# Patient Record
Sex: Female | Born: 1965
Health system: Southern US, Community
[De-identification: ages and names within clinical notes are randomized; demographics above are authoritative.]

## PROBLEM LIST (undated history)

## (undated) DIAGNOSIS — F32A Depression, unspecified: Secondary | ICD-10-CM

## (undated) DIAGNOSIS — Z780 Asymptomatic menopausal state: Secondary | ICD-10-CM

## (undated) DIAGNOSIS — F419 Anxiety disorder, unspecified: Secondary | ICD-10-CM

## (undated) DIAGNOSIS — F329 Major depressive disorder, single episode, unspecified: Secondary | ICD-10-CM

## (undated) HISTORY — PX: TUBAL LIGATION: SHX77

---

## 2012-11-19 ENCOUNTER — Other Ambulatory Visit (HOSPITAL_COMMUNITY): Payer: Self-pay | Admitting: Specialist

## 2012-11-19 DIAGNOSIS — Z1231 Encounter for screening mammogram for malignant neoplasm of breast: Secondary | ICD-10-CM

## 2012-12-03 ENCOUNTER — Ambulatory Visit (HOSPITAL_COMMUNITY): Payer: Self-pay | Attending: Specialist

## 2012-12-27 ENCOUNTER — Ambulatory Visit (HOSPITAL_COMMUNITY)
Admission: RE | Admit: 2012-12-27 | Discharge: 2012-12-27 | Disposition: A | Discharge status: Home / Self Care | Payer: Self-pay | Source: Ambulatory Visit | Attending: Specialist | Admitting: Specialist

## 2012-12-27 DIAGNOSIS — Z1231 Encounter for screening mammogram for malignant neoplasm of breast: Secondary | ICD-10-CM

## 2012-12-30 ENCOUNTER — Other Ambulatory Visit: Payer: Self-pay | Admitting: Specialist

## 2012-12-30 DIAGNOSIS — R928 Other abnormal and inconclusive findings on diagnostic imaging of breast: Secondary | ICD-10-CM

## 2013-01-14 ENCOUNTER — Encounter (HOSPITAL_COMMUNITY): Payer: Self-pay

## 2013-01-14 ENCOUNTER — Ambulatory Visit
Admission: RE | Admit: 2013-01-14 | Discharge: 2013-01-14 | Disposition: A | Discharge status: Home / Self Care | Payer: No Typology Code available for payment source | Source: Ambulatory Visit | Attending: Specialist | Admitting: Specialist

## 2013-01-14 ENCOUNTER — Other Ambulatory Visit: Payer: Self-pay | Admitting: Specialist

## 2013-01-14 ENCOUNTER — Ambulatory Visit (HOSPITAL_COMMUNITY)
Admission: RE | Admit: 2013-01-14 | Discharge: 2013-01-14 | Disposition: A | Discharge status: Home / Self Care | Payer: Self-pay | Source: Ambulatory Visit | Attending: Obstetrics and Gynecology | Admitting: Obstetrics and Gynecology

## 2013-01-14 VITALS — BP 118/72 | Temp 98.6°F | Ht 61.0 in | Wt 198.0 lb

## 2013-01-14 DIAGNOSIS — Z1239 Encounter for other screening for malignant neoplasm of breast: Secondary | ICD-10-CM

## 2013-01-14 DIAGNOSIS — R928 Other abnormal and inconclusive findings on diagnostic imaging of breast: Secondary | ICD-10-CM

## 2013-01-14 HISTORY — DX: Anxiety disorder, unspecified: F41.9

## 2013-01-14 HISTORY — DX: Major depressive disorder, single episode, unspecified: F32.9

## 2013-01-14 HISTORY — DX: Depression, unspecified: F32.A

## 2013-01-14 NOTE — Patient Instructions (Signed)
Taught Lovely Kerins how to perform BSE and gave educational materials to take home. Patient did not need a Pap smear today due to last Pap smear was March 2014 per patient. Let her know [facility] will cover Pap smears every 3 years unless has a history of abnormal Pap smears. Referred patient to the [facility] for da left breast diagnostic mammogram per recommendation. Appointment scheduled for Tuesday, January 14, 2013 at 1515. Patient aware of appointment and will be there. Alicia Olson verbalized understanding.  Darcey Demma, Kathaleen Maser, RN 1:11 PM

## 2013-01-14 NOTE — Progress Notes (Signed)
Patient referred to [facility] by the [facility] due to recommending additional imaging of the left breast. Screening mammogram completed 12/27/2012 at [facility]. Patient complained of some sharpe pain from her left nipple that happens occasionally.  Pap Smear:    Pap smear not completed today. Last Pap smear was March 2014 and normal per patient. Per patient no history of an abnormal Pap smear. No Pap smear results in EPIC.  Physical exam: Breasts Breasts symmetrical. No skin abnormalities bilateral breasts. No nipple retraction bilateral breasts. No nipple discharge bilateral breasts. No lymphadenopathy. No lumps palpated bilateral breasts. No complaints of pain or tenderness on exam. Referred patient to the [facility] for da left breast diagnostic mammogram per recommendation. Appointment scheduled for Tuesday, January 14, 2013 at 1515.    Pelvic/Bimanual No Pap smear completed today since last Pap smear was March 2014. Pap smear not indicated per BCCCP guidelines.

## 2013-06-04 ENCOUNTER — Other Ambulatory Visit: Payer: Self-pay | Admitting: Obstetrics and Gynecology

## 2013-06-04 DIAGNOSIS — N6009 Solitary cyst of unspecified breast: Secondary | ICD-10-CM

## 2013-07-06 ENCOUNTER — Emergency Department (HOSPITAL_COMMUNITY)
Admission: EM | Admit: 2013-07-06 | Discharge: 2013-07-06 | Disposition: A | Discharge status: Home / Self Care | Payer: Worker's Compensation | Attending: Emergency Medicine | Admitting: Emergency Medicine

## 2013-07-06 ENCOUNTER — Encounter (HOSPITAL_COMMUNITY): Payer: Self-pay | Admitting: Emergency Medicine

## 2013-07-06 DIAGNOSIS — Y929 Unspecified place or not applicable: Secondary | ICD-10-CM | POA: Insufficient documentation

## 2013-07-06 DIAGNOSIS — Y9389 Activity, other specified: Secondary | ICD-10-CM | POA: Diagnosis not present

## 2013-07-06 DIAGNOSIS — T2660XA Corrosion of cornea and conjunctival sac, unspecified eye, initial encounter: Secondary | ICD-10-CM | POA: Insufficient documentation

## 2013-07-06 DIAGNOSIS — Z8659 Personal history of other mental and behavioral disorders: Secondary | ICD-10-CM | POA: Insufficient documentation

## 2013-07-06 DIAGNOSIS — T2610XA Burn of cornea and conjunctival sac, unspecified eye, initial encounter: Secondary | ICD-10-CM

## 2013-07-06 DIAGNOSIS — S058X9A Other injuries of unspecified eye and orbit, initial encounter: Secondary | ICD-10-CM | POA: Insufficient documentation

## 2013-07-06 DIAGNOSIS — IMO0002 Reserved for concepts with insufficient information to code with codable children: Secondary | ICD-10-CM | POA: Insufficient documentation

## 2013-07-06 DIAGNOSIS — S0510XA Contusion of eyeball and orbital tissues, unspecified eye, initial encounter: Secondary | ICD-10-CM | POA: Diagnosis present

## 2013-07-06 MED ORDER — TETRACAINE HCL 0.5 % OP SOLN: 2.0000 [drp] | Freq: Once | OPHTHALMIC | Status: DC

## 2013-07-06 MED ORDER — FLUORESCEIN SODIUM 1 MG OP STRP: 1.0000 | ORAL_STRIP | Freq: Once | OPHTHALMIC | Status: DC

## 2013-07-06 MED ORDER — POLYMYXIN B-TRIMETHOPRIM 10000-0.1 UNIT/ML-% OP SOLN
1.0000 [drp] | OPHTHALMIC | Status: DC
  Administered 2013-07-06: 1 [drp] via OPHTHALMIC
  Filled 2013-07-06: qty 10

## 2013-07-06 MED ORDER — POLYMYXIN B-TRIMETHOPRIM 10000-0.1 UNIT/ML-% OP SOLN: 1.0000 [drp] | Freq: Four times a day (QID) | OPHTHALMIC | Status: DC

## 2013-07-06 NOTE — Discharge Instructions (Signed)
Conjuntivitis por sustancias qumicas (Chemical Conjunctivitis) El profesional que lo asiste le ha diagnosticado que usted padece conjuntivitis producida por sustancias qumicas. Se trata de una irritacin de la zona interna del prpado y de la parte blanca del ojo. Las causas de la conjuntivitis pueden ser una infeccin, [place] o irritacin qumica. En su caso, el origen es una irritacin producida por un producto qumico. Casi siempre se asocia con lagrimeo, sensibilidad a la luz, sensacin (percepcin) de Geneticist, molecular ojo, hinchazn de los prpados, y con Glass blower/designer intenso. A pesar del dolor, se autolimita y podr mejorar en veinticuatro horas.  INSTRUCCIONES PARA EL CUIDADO DOMICILIARIO  Para aliviar la molestia, aplique una compresa limpia y fra en el ojo durante 10 a 20 minutos, 3 a 4 veces por da.  No se frote los ojos.  Limpie suavemente todas las secreciones del ojo con un tis hmedo.  Lvese las manos con frecuencia con jabn y use toallas de papel para secarse.  Puede utilizar anteojos para el sol si la [employer].  No utilice maquillaje.  No utilice lentes de contacto hasta que la irritacin haya desaparecido.  No opere maquinarias ni conduzca si su visin es borrosa.  Tome los [employer] como le indic el profesional que lo asiste. Las lgrimas artificiales pueden causar molestias.  Evite las sustancias qumicas o los ambientes que le causaron el problema. Use proteccin en los ojos siempre que sea necesario. SOLICITE ATENCIN MDICA SI:  Si los ojos an estn irritados (inflamados) luego de 3 das de haber comenzado el tratamiento.  El [employer] ojos [place].  Usted presenta una secrecin que proviene de un ojo.  Sus prpados estn pegados por la [employer]  Tiene una mayor sensibilidad a Naval architect.  La temperatura oral se eleva sin motivo por encima de 38,9 C (80 F) o segn le indique el profesional que lo asiste.  Comienza a Psychiatrist.  Tiene algn problema que pueda relacionarse con el medicamento que est tomando. SOLICITE ATENCIN MDICA DE INMEDIATO SI:  La visin es empeora.  Presenta dolor intenso en el ojo. EST SEGURO QUE:   Comprende las instrucciones para el alta mdica.  Controlar su enfermedad.  Solicitar atencin mdica de inmediato segn las indicaciones. Document Released: 02/15/2005 Document Revised: 07/31/2011 [facility] Patient Information 2014 [place], [place]. 1 drop to the left eye

## 2013-07-06 NOTE — ED Notes (Signed)
Pt arrived to the ED with a complaint of eye pain after she splashed a small about of bleach in her eye.  Pt used the eye wash to rinse her eye for about 10 minutes.  St this time she feels as if she has a grain of sand in her eye.

## 2013-07-06 NOTE — ED Provider Notes (Signed)
CSN: [id number]     Arrival date & time 07/06/13  2155 History  This chart was scribed for non-physician practitioner, Junius Creamer, [place] working with Arbie Cookey, MD by Frederich Balding, ED scribe. This patient was seen in room [place] and the patient's care was started at 10:33 PM.   Chief Complaint  Patient presents with  . Eye Pain   The history is provided by the patient. No language interpreter was used.   HPI Comments: Alicia Olson is a 48 y.o. female who presents to the Emergency Department complaining of sudden onset left eye pain that started 13 hours ago after getting bleach in it. Pt states she washed her eye in for about 10 minutes but states it still feels like she has a grain of sand in her eye. Denies other injury. Pt's last tetanus was 5-7 years ago.   Past Medical History  Diagnosis Date  . Anxiety   . Depression    Past Surgical History  Procedure Laterality Date  . Tubal ligation    . Cesarean section      3 previous sections   Family History  Problem Relation Age of Onset  . Diabetes Father   . Hypertension Father    History  Substance Use Topics  . Smoking status: Never Smoker   . Smokeless tobacco: Never Used  . Alcohol Use: No   OB History   Grav Para Term Preterm Abortions TAB SAB Ect Mult Living   5 3 3  2  2   3      Review of Systems  Eyes: Positive for pain. Negative for visual disturbance.  Gastrointestinal: Negative for nausea.  Neurological: Negative for dizziness.  All other systems reviewed and are negative.   Allergies  Review of patient's allergies indicates no known allergies.  Home Medications  No current outpatient prescriptions on file.  BP 126/69  Pulse 69  Temp(Src) 97.9 F (36.6 C) (Oral)  Resp 18  SpO2 97%  Physical Exam  Nursing note and vitals reviewed. Constitutional: She is oriented to person, place, and time. She appears well-developed and well-nourished. No distress.  HENT:  Head: Normocephalic  and atraumatic.  Eyes: EOM are normal. Pupils are equal, round, and reactive to light. Right eye exhibits no discharge. Left eye exhibits no discharge.  Slit lamp exam:      The left eye shows corneal abrasion.  pH in left eye is 7. Left eye with two corneal abrasions. Abrasion at 9 o'clock overlapping distal part of iris and an abrasion at 3 o'clock. 2 mm ovals not involving the pupil.   Neck: Normal range of motion. Neck supple.  Cardiovascular: Normal rate.   Pulmonary/Chest: Effort normal. No respiratory distress.  Musculoskeletal: Normal range of motion.  Neurological: She is alert and oriented to person, place, and time.  Skin: Skin is warm and dry. No erythema.  Psychiatric: She has a normal mood and affect. Her behavior is normal.    ED Course  Procedures (including critical care time)  DIAGNOSTIC STUDIES: Oxygen Saturation is 97% on RA, normal by my interpretation.    COORDINATION OF CARE: 10:34 PM-Discussed treatment plan which includes testing pH in eye and checking for corneal abrasion with pt at bedside and pt agreed to plan.  10:39 PM-Advised pt she has two corneal abrasions and will be given antibiotic eye drops. Advised her to follow up with an ophthalmologist.   Labs Review Labs Reviewed - No data to display Imaging Review No results  found.  EKG Interpretation   None       MDM   Final diagnoses:  None     Chemical abrasion   I personally performed the services described in this documentation, which was scribed in my presence. The recorded information has been reviewed and is accurate.  Garald Balding, NP 07/06/13 2250

## 2013-07-08 NOTE — ED Provider Notes (Signed)
Medical screening examination/treatment/procedure(s) were performed by non-physician practitioner and as supervising physician I was immediately available for consultation/collaboration.   Houston Siren III, MD 07/08/13 1400

## 2013-07-14 ENCOUNTER — Other Ambulatory Visit: Payer: Self-pay | Admitting: Obstetrics and Gynecology

## 2013-07-14 ENCOUNTER — Other Ambulatory Visit: Payer: Self-pay

## 2013-07-14 DIAGNOSIS — N6009 Solitary cyst of unspecified breast: Secondary | ICD-10-CM

## 2013-07-18 ENCOUNTER — Ambulatory Visit
Admission: RE | Admit: 2013-07-18 | Discharge: 2013-07-18 | Disposition: A | Discharge status: Home / Self Care | Payer: No Typology Code available for payment source | Source: Ambulatory Visit | Attending: Obstetrics and Gynecology | Admitting: Obstetrics and Gynecology

## 2013-07-18 DIAGNOSIS — N6009 Solitary cyst of unspecified breast: Secondary | ICD-10-CM

## 2013-10-09 ENCOUNTER — Ambulatory Visit: Payer: Self-pay | Attending: Internal Medicine

## 2013-10-10 ENCOUNTER — Other Ambulatory Visit: Payer: Self-pay

## 2013-10-10 ENCOUNTER — Ambulatory Visit (HOSPITAL_BASED_OUTPATIENT_CLINIC_OR_DEPARTMENT_OTHER): Payer: Self-pay

## 2013-10-10 VITALS — BP 130/90 | HR 62 | Temp 98.2°F | Resp 14 | Ht 61.0 in | Wt 181.8 lb

## 2013-10-10 DIAGNOSIS — Z0000 Encounter for general adult medical examination without abnormal findings: Secondary | ICD-10-CM

## 2013-10-10 LAB — LIPID PANEL
CHOL/HDL RATIO: 4.2 ratio
Cholesterol: 186 mg/dL (ref 0–200)
HDL: 44 mg/dL (ref >39)
LDL Cholesterol: 126 mg/dL — ABNORMAL HIGH (ref 0–99)
Triglycerides: 79 mg/dL (ref <150)
VLDL: 16 mg/dL (ref 0–40)

## 2013-10-10 LAB — GLUCOSE (CC13): Glucose: 94 mg/dl (ref 70–140)

## 2013-10-10 LAB — HEMOGLOBIN A1C
Hgb A1c MFr Bld: 5.7 % — ABNORMAL HIGH (ref <5.7)
Mean Plasma Glucose: 117 mg/dL — ABNORMAL HIGH (ref <117)

## 2013-10-10 NOTE — Patient Instructions (Signed)
Discussed health assessment with patient. Will return for  Health Coaching She will be called with results of lab work. Patient will implement behavior modifications to help lower BP. Patient verbalized understanding.

## 2013-10-10 NOTE — Progress Notes (Signed)
Patient is a new patient to the [facility] program and is currently a [facility]CP patient effective 01/14/2013.   Clinical Measurements: Patient is 5 ft. 1 inches, weight 181.8 lbs, waist circumference 44 inches, and hip circumference 44 inches.   Medical History: Patient has no history of high cholesterol. Patient does not have a history of hypertension or diabetes. Per patient no diagnosed history of coronary heart disease, heart attack, heart failure, stroke/TIA, vascular disease or congenital heart defects.   Blood Pressure, Self-measurement: Patient states has no reason to check Blood pressure but does check at Pharmacy and is normal per their machine.  Nutrition Assessment: Patient stated that eats 2 fruits every day. Patient states she does not eat vegetables usually. Patient stated that  [place] 3 or more ounces of whole grains daily. Patient doesn't eat two or more servings of fish weekly. Patient states eats one fish per week. Patient states she does not drink more than 36 ounces or 450 calories of beverages with added sugars weekly. Patient states she drinks a lot of water. Patient stated she does not watch her salt intake. 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 Per patient runs, jumps and does other vigorous exercise for 420 minutes per week.  Smoking Status: Patient has never smoked. Patient is not exposed to smoke.  Quality of Life Assessment: In assessing patient's quality of life she stated that out of the past 30 days that she has felt her health is good all of them. Patient also stated that in the past 30 days that her mental health is not good including stress, depression and problems with emotions for 30 days. Patient did state that out of the past 30 days she felt her physical or mental health had not kept her from doing her usual activities including self-care, work or  recreation.   Plan: Lab work will be done today including a lipid panel, blood glucose, and Hgb A1C. Will call lab results when they are finished. Patient will returned for Health Coaching and BP check. Will work on behavior modifications that we discussed to lower BP.

## 2013-10-15 ENCOUNTER — Telehealth: Payer: Self-pay

## 2013-10-15 NOTE — Telephone Encounter (Signed)
Left message to return phone call or will return call at a later time.

## 2013-10-24 ENCOUNTER — Ambulatory Visit: Payer: Self-pay | Admitting: Internal Medicine

## 2013-11-04 ENCOUNTER — Telehealth: Payer: Self-pay

## 2013-11-04 NOTE — Telephone Encounter (Signed)
Have called three times to give lab results. Did not make doctor's. Was not able to give appointment due to did not know because of commiunicationhas not been available.Asked to return call.

## 2013-11-13 ENCOUNTER — Ambulatory Visit: Payer: Self-pay | Admitting: Internal Medicine

## 2013-11-15 ENCOUNTER — Encounter (HOSPITAL_COMMUNITY): Payer: Self-pay | Admitting: Emergency Medicine

## 2013-11-15 ENCOUNTER — Emergency Department (HOSPITAL_COMMUNITY)
Admission: EM | Admit: 2013-11-15 | Discharge: 2013-11-15 | Disposition: A | Discharge status: Home / Self Care | Payer: Self-pay | Attending: Emergency Medicine | Admitting: Emergency Medicine

## 2013-11-15 ENCOUNTER — Emergency Department (HOSPITAL_COMMUNITY): Payer: Self-pay

## 2013-11-15 DIAGNOSIS — Z8659 Personal history of other mental and behavioral disorders: Secondary | ICD-10-CM | POA: Insufficient documentation

## 2013-11-15 DIAGNOSIS — W102XXA Fall (on)(from) incline, initial encounter: Secondary | ICD-10-CM

## 2013-11-15 DIAGNOSIS — S0083XA Contusion of other part of head, initial encounter: Secondary | ICD-10-CM | POA: Insufficient documentation

## 2013-11-15 DIAGNOSIS — S0003XA Contusion of scalp, initial encounter: Secondary | ICD-10-CM | POA: Insufficient documentation

## 2013-11-15 DIAGNOSIS — Y9301 Activity, walking, marching and hiking: Secondary | ICD-10-CM | POA: Insufficient documentation

## 2013-11-15 DIAGNOSIS — Y9289 Other specified places as the place of occurrence of the external cause: Secondary | ICD-10-CM | POA: Insufficient documentation

## 2013-11-15 DIAGNOSIS — W010XXA Fall on same level from slipping, tripping and stumbling without subsequent striking against object, initial encounter: Secondary | ICD-10-CM | POA: Insufficient documentation

## 2013-11-15 DIAGNOSIS — S1093XA Contusion of unspecified part of neck, initial encounter: Principal | ICD-10-CM

## 2013-11-15 MED ORDER — HYDROCODONE-ACETAMINOPHEN 5-325 MG PO TABS: ORAL_TABLET | ORAL | Status: DC

## 2013-11-15 NOTE — ED Notes (Signed)
Patient states she was walking up a hill and tripped, hitting her nose on concrete. Patient states ever since the fall she has nasal pain and headache.

## 2013-11-15 NOTE — ED Provider Notes (Signed)
CSN: [id number]     Arrival date & time 11/15/13  1405 History  This chart was scribed for non-physician practitioner, Monico Blitz, PA-C,working with Richarda Blade, MD, by Marlowe Kays, ED Scribe.  This patient was seen in room WTR6/WTR6 and the patient's care was started at 4:28 PM.  Chief Complaint  Patient presents with  . Facial Pain   The history is provided by the patient. No language interpreter was used.   HPI Comments:  Alicia Olson is a 48 y.o. female who presents to the Emergency Department complaining of tripping and falling on to concrete striking her face PTA. Pt states she experienced some dizziness after the fall that has since resolved. She reports associated right-sided neck pain and pain in her eyes whenever she looks to the left or right. Pt states she has not taken any medication for pain. She denies CP, SOB, LOC, nausea or vomiting, or pain to any extremities.  Past Medical History  Diagnosis Date  . Anxiety   . Depression    Past Surgical History  Procedure Laterality Date  . Tubal ligation    . Cesarean section      3 previous sections   Family History  Problem Relation Age of Onset  . Diabetes Father   . Hypertension Father    History  Substance Use Topics  . Smoking status: Never Smoker   . Smokeless tobacco: Never Used  . Alcohol Use: No   OB History   Grav Para Term Preterm Abortions TAB SAB Ect Mult Living   5 3 3  2  2   3      Review of Systems A complete 10 system review of systems was obtained and all systems are negative except as noted in the HPI and PMH.   Allergies  Review of patient's allergies indicates no known allergies.  Home Medications   Prior to Admission medications   Medication Sig Start Date End Date Taking? Authorizing Provider  HYDROcodone-acetaminophen (NORCO/VICODIN) 5-325 MG per tablet Take 1-2 tablets by mouth every 6 hours as needed for pain. 11/15/13   Sanjiv Castorena, PA-C   Triage Vitals: BP  124/80  Pulse 75  Temp(Src) 98.5 F (36.9 C) (Oral)  Resp 16  SpO2 97% Physical Exam  Nursing note and vitals reviewed. Constitutional: She is oriented to person, place, and time. She appears well-developed and well-nourished.  HENT:  Head: Normocephalic.    Mouth/Throat: Oropharynx is clear and moist.  Extraocular movement is intact however there is pain with lateral eye movement on either side  Eyes: EOM are normal. Pupils are equal, round, and reactive to light.  Neck: Normal range of motion. Neck supple.  No midline C-spine  tenderness to palpation or step-offs appreciated. Patient has full range of motion without pain.   Cardiovascular: Normal rate, regular rhythm and intact distal pulses.   Pulmonary/Chest: Effort normal and breath sounds normal. No respiratory distress. She has no wheezes. She has no rales. She exhibits no tenderness.  Abdominal: Soft. Bowel sounds are normal. She exhibits no distension and no mass. There is no tenderness. There is no rebound and no guarding.  Musculoskeletal: Normal range of motion.  Neurological: She is alert and oriented to person, place, and time.  Skin: Skin is warm and dry.  Psychiatric: She has a normal mood and affect. Her behavior is normal.    ED Course  Procedures (including critical care time) DIAGNOSTIC STUDIES: Oxygen Saturation is 97% on RA, normal by my interpretation.  COORDINATION OF CARE: 4:31 PM- Will order pain medication and CT facial bones. Pt verbalizes understanding and agrees to plan.  Medications - No data to display  Labs Review Labs Reviewed - No data to display  Imaging Review Ct Maxillofacial Wo Cm  11/15/2013   CLINICAL DATA:  Fall on concrete striking the face. Right-sided neck pain and pain in the face/ eyes.  EXAM: CT MAXILLOFACIAL WITHOUT CONTRAST  TECHNIQUE: Multidetector CT imaging of the maxillofacial structures was performed. Multiplanar CT image reconstructions were also generated. A small  metallic BB was placed on the right temple in order to reliably differentiate right from left.  COMPARISON:  None.  FINDINGS: Visualized portion of the brain is grossly unremarkable. Globes appear intact. No maxillofacial fracture is identified. Temporomandibular joints are located. Paranasal sinuses and visualized mastoid air cells are clear.  IMPRESSION: No maxillofacial fracture identified.   Electronically Signed   By: Logan Bores   On: 11/15/2013 17:18     EKG Interpretation None      MDM   Final diagnoses:  Facial contusion, initial encounter  Fall (on)(from) incline, initial encounter    Filed Vitals:   11/15/13 1419 11/15/13 1541  BP: 146/75 124/80  Pulse: 70 75  Temp: 98.4 F (36.9 C) 98.5 F (36.9 C)  TempSrc: Oral Oral  Resp: 16 16  SpO2: 97% 97%    Medications - No data to display  Alicia Olson is a 48 y.o. female presenting with nasal bridge trauma after slip and fall off walking uphill. She also has pain with eye movement. Maxillofacial CT negative.  Evaluation does not show pathology that would require ongoing emergent intervention or inpatient treatment. Pt is hemodynamically stable and mentating appropriately. Discussed findings and plan with patient/guardian, who agrees with care plan. All questions answered. Return precautions discussed and outpatient follow up given.   New Prescriptions   HYDROCODONE-ACETAMINOPHEN (NORCO/VICODIN) 5-325 MG PER TABLET    Take 1-2 tablets by mouth every 6 hours as needed for pain.    I personally performed the services described in this documentation, which was scribed in my presence. The recorded information has been reviewed and is accurate.    Monico Blitz, PA-C 11/15/13 1750

## 2013-11-15 NOTE — Discharge Instructions (Signed)
Rest, Ice intermittently (in the first 24-48 hours)  Take up to 800mg  of ibuprofen (that is usually 4 over the counter pills)  3 times a day for 5 days. Take with food.  Take vicodin for breakthrough pain, do not drink alcohol, drive, care for children or do other critical tasks while taking vicodin.  Please follow with your primary care doctor in the next 2 days for a check-up. They must obtain records for further management.     Descanso, Hielo intermitente ( en las primeras 24 a 48 horas )  Tomar hasta 800 mg de ibuprofeno ( que suele ser de 4 sobre las pldoras de Radio broadcast assistant ) 3 veces [place] 5 [place]. Tmelo con comida .  Tome vicodin para el dolor irruptivo , no beber alcohol, conducir , el cuidado de los nios o hacer otras tareas crticas mientras tomo vicodin .  Por favor, siga con su mdico de atencin primaria en los prximos 2 das para un chequeo. Deben obtener los registros para Nutritional therapist .  No dudara en volver a la sala de urgencias para cualquier nuevo empeoramiento ni respecto de los sntomas.  Do not hesitate to return to the Emergency Department for any new, worsening or concerning symptoms.    Contusin (Contusion) Una contusin es un hematoma profundo. Las contusiones son el resultado de una lesin que causa sangrado debajo de la piel. La zona de la contusin puede ponerse [place], [place] o [place]. Las lesiones menores causarn contusiones sin Social research officer, government, Armed forces training and education officer las ms graves pueden presentar dolor e inflamacin durante un par de semanas.  CAUSAS  Generalmente, una contusin se debe a un golpe, un traumatismo o una fuerza directa en una zona del cuerpo. SNTOMAS   Hinchazn y enrojecimiento en la zona de la lesin.  Hematomas en la zona de la lesin.  Dolor con la palpacin y sensibilidad en la zona de la lesin.  Dolor. DIAGNSTICO  Se puede establecer el diagnstico al hacer una historia clnica y un examen fsico. Letitia Caul vez sea necesario hacer una  radiografa, una tomografa computarizada o una resonancia magntica para determinar si hay lesiones asociadas, como fracturas. TRATAMIENTO  El tratamiento especfico depender de la zona del cuerpo donde se produjo la lesin. En general, el mejor tratamiento para una contusin es el reposo, la aplicacin de hielo, la elevacin de la zona y la aplicacin de compresas fras en la zona de la lesin. Para calmar el dolor tambin podrn recomendarle medicamentos de venta libre. Pregntele al mdico cul es el mejor tratamiento para su contusin. INSTRUCCIONES PARA EL CUIDADO EN EL HOGAR   Aplique hielo sobre la zona lesionada.  Ponga el hielo en una bolsa plstica.  Colquese una toalla entre la piel y la bolsa de hielo.  Deje el hielo durante 15 a 84minutos, 3 a 4veces por da, o [place] los medicamentos de venta libre o recetados para Glass blower/designer, el malestar o la [place], segn se lo indique el mdico. El mdico podr indicarle que evite tomar antiinflamatorios (aspirina, ibuprofeno y naproxeno) durante 8 horas ya que estos medicamentos pueden aumentar los hematomas.  Mantenga la zona de la lesin en reposo.  Si es posible, eleve la zona de la lesin para reducir la hinchazn. SOLICITE ATENCIN MDICA DE INMEDIATO SI:   El hematoma o la hinchazn aumentan.  Siente dolor que [place].  La hinchazn o el dolor no se [employer]. ASEGRESE DE QUE:  Comprende estas instrucciones.  Controlar su afeccin.  Recibir ayuda de inmediato si no mejora o si empeora. Document Released: 02/15/2005 Document Revised: 05/13/2013 [facility] Patient Information 2015 [place]. This information is not intended to replace advice given to you by your health care provider. Make sure you discuss any questions you have with your health care provider.

## 2013-11-16 NOTE — ED Provider Notes (Signed)
Medical screening examination/treatment/procedure(s) were performed by non-physician practitioner and as supervising physician I was immediately available for consultation/collaboration.  Richarda Blade, MD 11/16/13 [phone]

## 2013-11-20 ENCOUNTER — Ambulatory Visit: Payer: Self-pay | Attending: Internal Medicine | Admitting: Internal Medicine

## 2013-11-20 ENCOUNTER — Encounter: Payer: Self-pay | Admitting: Internal Medicine

## 2013-11-20 VITALS — BP 127/72 | HR 82 | Temp 98.4°F | Resp 14 | Ht 60.0 in | Wt 190.0 lb

## 2013-11-20 DIAGNOSIS — H11003 Unspecified pterygium of eye, bilateral: Secondary | ICD-10-CM

## 2013-11-20 DIAGNOSIS — Z0000 Encounter for general adult medical examination without abnormal findings: Secondary | ICD-10-CM

## 2013-11-20 DIAGNOSIS — H11009 Unspecified pterygium of unspecified eye: Secondary | ICD-10-CM | POA: Insufficient documentation

## 2013-11-20 DIAGNOSIS — Z006 Encounter for examination for normal comparison and control in clinical research program: Secondary | ICD-10-CM | POA: Insufficient documentation

## 2013-11-20 LAB — CBC
HCT: 41.2 % (ref 36.0–46.0)
Hemoglobin: 14.2 g/dL (ref 12.0–15.0)
MCH: 29 pg (ref 26.0–34.0)
MCHC: 34.5 g/dL (ref 30.0–36.0)
MCV: 84.1 fL (ref 78.0–100.0)
PLATELETS: 238 10*3/uL (ref 150–400)
RBC: 4.9 MIL/uL (ref 3.87–5.11)
RDW: 14.9 % (ref 11.5–15.5)
WBC: 7.5 10*3/uL (ref 4.0–10.5)

## 2013-11-20 LAB — COMPLETE METABOLIC PANEL WITH GFR
ALT: 28 U/L (ref 0–35)
AST: 18 U/L (ref 0–37)
Albumin: 4.1 g/dL (ref 3.5–5.2)
Alkaline Phosphatase: 88 U/L (ref 39–117)
BUN: 19 mg/dL (ref 6–23)
CO2: 22 mEq/L (ref 19–32)
Calcium: 9.3 mg/dL (ref 8.4–10.5)
Chloride: 105 mEq/L (ref 96–112)
Creat: 0.53 mg/dL (ref 0.50–1.10)
GFR, Est African American: >89 mL/min
GFR, Est Non African American: >89 mL/min
Glucose, Bld: 98 mg/dL (ref 70–99)
Potassium: 3.9 mEq/L (ref 3.5–5.3)
Sodium: 137 mEq/L (ref 135–145)
Total Bilirubin: 0.3 mg/dL (ref 0.2–1.2)
Total Protein: 7.2 g/dL (ref 6.0–8.3)

## 2013-11-20 NOTE — Patient Instructions (Signed)
Ejercicios para perder peso (Exercise to Lose Weight) La actividad fsica y Ardelia Mems dieta saludable ayudan a perder peso. El mdico podr sugerirle ejercicios especficos. IDEAS Y CONSEJOS PARA HACER EJERCICIOS  Elija opciones econmicas que disfrute hacer , como caminar, andar en bicicleta o los vdeos para ejercitarse.  Utilice las Clinical cytogeneticist del ascensor.  Camine durante la hora del almuerzo.  Estacione el auto lejos del lugar de [place] o [place].  Concurra a un gimnasio o tome clases de gimnasia.  Comience con 5  10 minutos de actividad fsica por da. Ejercite hasta 30 minutos, 4 a 6 das por semana.  Utilice zapatos que tengan un buen soporte y ropas cmodas.  Elongue antes y despus de Chief Technology Officer.  Ejercite hasta que aumente la respiracin y el corazn palpite rpido.  Beba agua extra cuando ejercite.  No haga ejercicio Contractor, sentirse mareado o que le falte mucho el aire. La actividad fsica puede quemar alrededor de 150 caloras.  Correr 20 cuadras en 15 minutos.  Jugar vley durante 45 a 60 minutos.  Limpiar y encerar el auto durante 45 a 60 minutos.  Jugar ftbol americano de toque.  Caminar 25 cuadras en 35 minutos.  Empujar un cochecito 20 cuadras en 30 minutos.  Jugar baloncesto durante 30 minutos.  Rastrillar hojas secas durante 30 minutos.  Andar en bicicleta 80 cuadras en 30 minutos.  Caminar 30 cuadras en 30 minutos.  Bailar durante 30 minutos.  Quitar la nieve con una pala durante 15 minutos.  Nadar vigorosamente durante 20 minutos.  Subir escaleras durante 15 minutos.  Andar en bicicleta 60 cuadras durante 15 minutos.  Arreglar el jardn entre 30 y 39 minutos.  Saltar a la soga durante 15 minutos.  Limpiar vidrios o pisos durante 45 a 60 minutos. Document Released: 08/12/2010 Document Revised: 07/31/2011 [facility] Patient Information 2015 [place]. This information is not intended to replace advice given to you  by your health care provider. Make sure you discuss any questions you have with your health care provider.Plan de alimentacin DASH (DASH Eating Plan) DASH es la sigla en ingls de "Enfoques alimentarios para bajar la hipertensin". El plan de alimentacin DASH ha demostrado bajar la presin arterial elevada (hipertensin). Los beneficios adicionales para la salud pueden incluir la disminucin del riesgo de diabetes mellitus tipo2, enfermedades cardacas e ictus. Este plan tambin puede ayudar a Horticulturist, commercial. QU DEBO SABER ACERCA DEL PLAN DE ALIMENTACIN DASH? Para el plan de alimentacin DASH, seguir las siguientes pautas generales:  Elija los alimentos con un valor porcentual diario de sodio de menos del 5% (segn figura en la etiqueta del alimento).  Use hierbas o aderezos sin sal, en lugar de sal de mesa o sal marina.  Consulte al mdico o farmacutico antes de usar sustitutos de la sal.  Coma productos con bajo contenido de sodio, cuya etiqueta suele decir "bajo contenido de sodio" o "sin agregado de sal".  Coma alimentos frescos.  Coma ms verduras, frutas y productos lcteos con bajo contenido de [place].  Elija los cereales integrales. Busque el trmino "integrales" como la segunda palabra en la lista de ingredientes.  Elija el pescado y el pollo o el pavo sin piel ms a menudo que las carnes rojas. Limite el consumo de pescado, carne de ave y carne a 6onzas (170g) por Training and development officer.  Limite el consumo de dulces, postres, azcares y bebidas azucaradas.  Elija las grasas saludables para el corazn.  Limite el consumo de queso a 1oz (28g) por Training and development officer.  Consuma ms alimentos caseros y [employer] comida rpida, de bufs o de restaurantes.  Limite el consumo de alimentos fritos.  Cocine los alimentos con otros mtodos que no sean la fritura.  Limite las verduras enlatadas. Si las consume, enjuguelas bien para disminuir el sodio.  Cuando coma en un restaurante, pida que preparen su comida  con menos sal o, en lo posible, sin nada de sal. QU ALIMENTOS PUEDO COMER? Pida ayuda a un nutricionista para conocer las necesidades calricas individuales. Cereales Pan de salvado o integral. Arroz integral. Pastas de salvado o integrales. Quinua, trigo burgol y cereales integrales. Cereales con bajo contenido de sodio. Tortillas de harina de maz o de salvado. Pan de maz integral. Galletas saladas integrales. Galletas con bajo contenido de [place]. Vegetales Verduras frescas o congeladas (crudas, al vapor, asadas o grilladas). Jugos de tomate y verduras con contenido bajo o reducido de sodio. Pasta y salsa de tomate con contenido bajo o [place]. Verduras enlatadas con bajo contenido de sodio o reducido de sodio.  Lambert Mody Lambert Mody frescas, en conserva (en su jugo natural) o frutas congeladas. Carnes y otras fuentes de protenas Carne de res [place] (al 85% o ms [place]), carne de res de animales alimentados con pastos o carne de res sin la grasa. Pollo o pavo sin piel. Carne de pollo o de [place]. Cerdo sin la grasa. Todos los pescados y frutos de mar. Huevos. Porotos, guisantes o lentejas secos. Frutos secos y semillas sin sal. Frijoles enlatados sin sal. Lcteos Productos lcteos con bajo contenido de grasas, como [place] o al 1%, quesos reducidos en grasas o al 2%, ricota con bajo contenido de grasas o [employer], o yogur natural con bajo contenido de [place]. Quesos con contenido bajo o reducido de sodio. Grasas y Naval architect en barra que no contengan grasas trans. [place] y alios para ensaladas livianos o reducidos en grasas (reducidos en sodio). [place]. Aceites de crtamo, oliva o canola. [place] natural de man o almendra. Otros Palomitas de maz y pretzels sin sal. Esta no es una lista completa de los alimentos o las bebidas recomendados. Consulte a su nutricionista para conocer ms opciones. QU ALIMENTOS NO ESTN RECOMENDADOS? Cereales Pan  blanco. Pastas blancas. Arroz blanco. Pan de maz refinado. Bagels y croissants. Galletas saladas que contengan grasas trans. Vegetales Vegetales con crema o fritos. Verduras en [place]. Verduras enlatadas comunes. Pasta y salsa de tomate en lata comunes. Jugos comunes de tomate y de verduras. Lambert Mody Frutas secas. Fruta enlatada en almbar liviano o espeso. Jugo de frutas. Carnes y otras fuentes de protenas [place] de carne con Lobbyist. Costillas, alas de pollo, tocineta, salchicha, mortadela, salame, chinchulines, tocino, perros calientes, salchichas alemanas y embutidos envasados. Frutos secos y semillas con sal. Frijoles con sal en lata. Lcteos Leche entera o al 2%, crema, mezcla de [place] y crema y queso crema. Yogur entero o endulzado. Quesos o queso azul con alto contenido de Physicist, medical. Cremas y coberturas batidas no lcteas. Quesos procesados, quesos para untar o cuajadas. Condimentos Sal de cebolla y ajo, sal condimentada, sal de mesa y sal marina. Salsas en lata y envasadas. Salsa [place]. Salsa trtara. Salsa barbacoa. Salsa teriyaki. Salsa de soja, incluso la que tiene contenido reducido de [place]. Salsa de carne. Salsa de pescado. Salsa de [place]. Salsa rosada. Rbanos picantes. Ketchup y mostaza. Saborizantes y tiernizantes para carne. Caldo en cubitos. Salsa picante. Salsa tabasco. Adobos. Aderezos para tacos. Salsas. Grasas y aceites [place], [place] en barra, [place] de [place], [place],  mantequilla clarificada y [place]. Aceites de coco, de palmiste o de palma. Aderezos comunes para ensalada. Otros Pickles y [place]. Palomitas de maz y pretzels con sal. Esta no es [employer] de los alimentos y las bebidas que Nurse, adult. Consulte a su nutricionista para obtener ms informacin. DNDE Dolan Amen MS INFORMACIN? [place], [place] y Herbalist (National Heart, Lung, and [place]):  travelstabloid.com Document Released: 04/27/2011 Document Revised: 05/13/2013 [facility] Patient Information 2015 [place], [place]. This information is not intended to replace advice given to you by your health care provider. Make sure you discuss any questions you have with your health care provider.

## 2013-11-20 NOTE — Progress Notes (Signed)
Patient presents to establish care Was seen in ED over weekend for fall with injury to both eyes and left wrist. States she had not had a period in 2 years then 3 weeks ago had some spotting for one day. Interpreter present

## 2013-11-20 NOTE — Progress Notes (Signed)
Patient ID: Alicia Olson, female   DOB: Mar 08, 1966, 48 y.o.   MRN: [id number]  [id number]:[id number]  [id number]  DOB - 11-08-1965  CC:  Chief Complaint  Patient presents with  . Establish Care       HPI: Alicia Olson is a 48 y.o. female here today to establish medical care.  Patient reports that she fell and hit her face a few days ago.  She was evaluated by the ER and is now having pain in her eyes when she blinks. Patient reports that even before she fell she always felt like she had a foreign body in her eyes for the past 6 years.  She reports that she was evaluated by a opthalmologist d/t bleach splashing in her eyes at work.  Patient states that she was told that she may need a surgery to remove the pytergium in her eye because it is affecting her vision.  Patient has No headache, No chest pain, No abdominal pain - No Nausea, No new weakness tingling or numbness, No Cough - SOB.  No Known Allergies Past Medical History  Diagnosis Date  . Anxiety   . Depression    Current Outpatient Prescriptions on File Prior to Visit  Medication Sig Dispense Refill  . HYDROcodone-acetaminophen (NORCO/VICODIN) 5-325 MG per tablet Take 1-2 tablets by mouth every 6 hours as needed for pain.  15 tablet  0   No current facility-administered medications on file prior to visit.   Family History  Problem Relation Age of Onset  . Diabetes Father   . Hypertension Father    History   Social History  . Marital Status: Single    Spouse Name: N/A    Number of Children: N/A  . Years of Education: N/A   Occupational History  . Not on file.   Social History Main Topics  . Smoking status: Never Smoker   . Smokeless tobacco: Never Used  . Alcohol Use: No  . Drug Use: No  . Sexual Activity: Yes    Birth Control/ Protection: Surgical   Other Topics Concern  . Not on file   Social History Narrative  . No narrative on file    Review of Systems: Constitutional: Negative for fever,  chills, diaphoresis, activity change, appetite change and fatigue. HENT: Negative for ear pain, nosebleeds, congestion, facial swelling, rhinorrhea, neck pain, neck stiffness and ear discharge.  Eyes: Negative for pain, discharge, redness, itching and visual disturbance. Respiratory: Negative for cough, choking, chest tightness, shortness of breath, wheezing and stridor.  Cardiovascular: Negative for chest pain, palpitations and leg swelling. Gastrointestinal: Negative for abdominal distention. Genitourinary: Negative for dysuria, urgency, frequency, hematuria, flank pain, decreased urine volume, difficulty urinating and dyspareunia.  Musculoskeletal: Negative for back pain, joint swelling, arthralgia and gait problem. Neurological: Negative for dizziness, tremors, seizures, syncope, facial asymmetry, speech difficulty, weakness, light-headedness, numbness and headaches.  Hematological: Negative for adenopathy. Does not bruise/bleed easily. Psychiatric/Behavioral: Negative for hallucinations, behavioral problems, confusion, dysphoric mood, decreased concentration and agitation.    Objective:   Filed Vitals:   11/20/13 1059  BP: 127/72  Pulse: 82  Temp: 98.4 F (36.9 C)  Resp: 14    Physical Exam: Constitutional: Patient appears well-developed and well-nourished. No distress. HENT: Normocephalic, atraumatic, External right and left ear normal. Oropharynx is clear and moist.  Eyes: Conjunctivae and EOM are normal. 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 Neck: Normal ROM. Neck supple. No JVD. No tracheal deviation. No thyromegaly. CVS: RRR, S1/S2 +, no murmurs, no gallops,  no carotid bruit.  Pulmonary: Effort and breath sounds normal, no stridor, rhonchi, wheezes, rales.  Abdominal: Soft. BS +, no distension, tenderness, rebound or guarding.  Musculoskeletal: Normal range of motion. No edema and no tenderness.  Lymphadenopathy: No lymphadenopathy noted, cervical,  inguinal or axillary Neuro: Alert. Normal reflexes, muscle tone coordination. No cranial nerve deficit. Skin: Skin is warm and dry. No rash noted. Not diaphoretic. No erythema. No pallor. Psychiatric: Normal mood and affect. Behavior, judgment, thought content normal.  No results found for this basename: WBC, HGB, HCT, MCV, PLT   No results found for this basename: CREATININE, BUN, NA, K, CL, CO2    Lab Results  Component Value Date   HGBA1C 5.7* 10/10/2013   Lipid Panel     Component Value Date/Time   CHOL 186 10/10/2013 1203   TRIG 79 10/10/2013 1203   HDL 44 10/10/2013 1203   CHOLHDL 4.2 10/10/2013 1203   VLDL 16 10/10/2013 1203   LDLCALC 126* 10/10/2013 1203       Assessment and plan:   Alicia Olson was seen today for establish care.  Diagnoses and associated orders for this visit:  Preventative health care - CBC - TSH - COMPLETE METABOLIC PANEL WITH GFR - Vitamin D, 25-hydroxy  Pterygium eye, bilateral Patient given list to call for financial assistance in obtain a eye exam.     Return if symptoms worsen or fail to improve.     Chari Manning, [facility] [phone] 11/27/2013, 11:25 AM

## 2013-11-21 LAB — VITAMIN D 25 HYDROXY (VIT D DEFICIENCY, FRACTURES): Vit D, 25-Hydroxy: 29 ng/mL — ABNORMAL LOW (ref 30–89)

## 2013-11-21 LAB — TSH: TSH: 0.787 u[IU]/mL (ref 0.350–4.500)

## 2013-11-24 ENCOUNTER — Telehealth: Payer: Self-pay | Admitting: *Deleted

## 2013-11-24 NOTE — Telephone Encounter (Signed)
Message copied by Velora Heckler on Mon Nov 24, 2013 11:25 AM ------      Message from: Chari Manning A      Created: Sun Nov 23, 2013  7:57 PM       Please call patient and let her know she needs over-the-counter vitamin D to take daily, level slightly low. All other labs are normal. Thanks ------

## 2013-11-24 NOTE — Telephone Encounter (Signed)
Attempted to reach patient via [employer], however, there was no answer and no VM set up.

## 2013-11-27 ENCOUNTER — Telehealth: Payer: Self-pay

## 2013-11-27 NOTE — Telephone Encounter (Signed)
Third call to try and get in touch about labs and appointment that she missed because has not returned calls.  PLAN: Patient is off on Mondays and will try and call her if she does not return call.

## 2013-12-01 ENCOUNTER — Telehealth: Payer: Self-pay

## 2013-12-01 NOTE — Telephone Encounter (Signed)
Patient has not returned calls. Girl at house stated she had gone out and has been busy.   PLAN: Will try and call back at three.

## 2013-12-08 ENCOUNTER — Ambulatory Visit: Payer: Self-pay

## 2013-12-08 VITALS — BP 130/92

## 2013-12-08 DIAGNOSIS — Z789 Other specified health status: Secondary | ICD-10-CM

## 2013-12-08 NOTE — Progress Notes (Signed)
Patient returns today for Health Coaching regarding Nutrition for her borderline AIC, elevated blood pressure and activity with interpreter.   NUTRITION: Patient had appointment [facility] on July 2 for follow up broken nose.  Discussed watching carbohydrates, how to count them, serving sizes and number of carbs per day allowance. Patient received and reviewed handouts in Spanish. Gave patient measuring cup to measure serving sizes and demonstrated the serving sizes. Handouts on increasing fiber given. Explained how fiber could lower LDL'S and exercise.   BLOOD PRESSURE: Discussed risk factors that need to work on.  ACTIVITY: Discussed walking. Patient received pedometer and was explained and shown how it works.   PLAN: Decrease carbohydrates in diet. Increase amount of walking per day and add other exercises to plan.Follow 1600 calorie menu plan. Return for further health coaching.

## 2013-12-08 NOTE — Patient Instructions (Addendum)
Will decrease carbohydrate count by measuring serving sizes and counting number of carbohydrates to keep below 200 GM. Will wear pedometer and work up to 10,000 steps three times a week. Will read labels of products for fiber and carbohydrate amount. Will increase fiber in diet. Will follow 1600 calorie food plan. Verbalized understanding.

## 2013-12-15 ENCOUNTER — Encounter (HOSPITAL_COMMUNITY): Payer: Self-pay | Admitting: Emergency Medicine

## 2013-12-15 ENCOUNTER — Emergency Department (INDEPENDENT_AMBULATORY_CARE_PROVIDER_SITE_OTHER)
Admission: EM | Admit: 2013-12-15 | Discharge: 2013-12-15 | Disposition: A | Discharge status: Home / Self Care | Payer: Self-pay | Source: Home / Self Care | Attending: Family Medicine | Admitting: Family Medicine

## 2013-12-15 DIAGNOSIS — G44209 Tension-type headache, unspecified, not intractable: Secondary | ICD-10-CM

## 2013-12-15 MED ORDER — KETOROLAC TROMETHAMINE 60 MG/2ML IM SOLN
INTRAMUSCULAR | Status: AC
  Filled 2013-12-15: qty 2

## 2013-12-15 MED ORDER — DIPHENHYDRAMINE HCL 25 MG PO CAPS
ORAL_CAPSULE | ORAL | Status: AC
  Filled 2013-12-15: qty 1

## 2013-12-15 MED ORDER — CYCLOBENZAPRINE HCL 5 MG PO TABS: 5.0000 mg | ORAL_TABLET | Freq: Three times a day (TID) | ORAL | Status: AC | PRN

## 2013-12-15 MED ORDER — DIPHENHYDRAMINE HCL 25 MG PO CAPS
25.0000 mg | ORAL_CAPSULE | Freq: Once | ORAL | Status: AC
  Administered 2013-12-15: 25 mg via ORAL

## 2013-12-15 MED ORDER — KETOROLAC TROMETHAMINE 60 MG/2ML IM SOLN
60.0000 mg | Freq: Once | INTRAMUSCULAR | Status: AC
  Administered 2013-12-15: 60 mg via INTRAMUSCULAR

## 2013-12-15 NOTE — ED Notes (Signed)
C/o headache and nausea States she does have right side neck pain States pain is in the back of her head radiating to her temple

## 2013-12-15 NOTE — Discharge Instructions (Signed)
You are suffering from a tension type headache.  The medications given you in the clinic will help to relieve your pain Please start the flexeril tonight for continued headache and tension in the neck.  Flexeril is a muscle relaxer You may also take tylenol 1000mg  every 8 hours if needed You may start ibuprofen 600mg  every 6 hours starting tomorrow after 5pm Please come back if your headache becomes worse.   Usted est sufriendo de una cefalea tensional. Los medicamentos que figuran en la clnica le ayudarn a Public house manager su dolor Por favor inicie la flexeril esta noche para continuar el dolor de cabeza y la tensin en el cuello. Flexeril es un relajante muscular Usted tambin puede tomar 1000mg  Tylenol cada 8 horas si es necesario Usted puede comenzar a 600mg  de ibuprofeno cada 6 horas a partir de [employer] de las 5pm Por favor, vuelve si su dolor de cabeza empeora. Cefalea tensional  (Tension Headache)  Una cefalea tensional es una sensacin de dolor y opresin en la frente y los lados de la cabeza. El dolor puede ser sordo o puede sentirse que comprime (constrictivo). Este es el tipo ms comn de dolor de [place]. Generalmente no se asocian con nuseas o vmitos y no empeoran con la actividad fsica. Pueden durar desde 30 minutos a varios das.  CAUSAS  Se desconoce la causa exacta, pero puede ser causada por las sustancias qumicas y las hormonas del cerebro que producen dolor. Suelen comenzar despus de una situacin de estrs, ansiedad o por depresin. Otros desencadenantes pueden ser:   El alcohol.  Cafena (demasiada o abstinencia).  Infecciones respiratorias (resfriados, gripes, sinusitis).  Problemas dentales o apretar los dientes.  Fatiga.  Mantener la cabeza y el cuello en una posicin demasiado tiempo mientras utiliza un ordenador. SNTOMAS   Presin alrededor de la cabeza.   Dolor "sordo" en la cabeza.   Dolor que siente sobre la frente y los lados de la cabeza.    Sensibilidad en los msculos de la cabeza, del cuello y de los hombros. DIAGNSTICO  El diagnstico se realiza en base a:   Sntomas.   Examen fsico.   Ardelia Mems TC (tomografa computada) o resonancia magntica de la cabeza. Se pueden pedir estas pruebas, si los sntomas son severos o inusuales. TRATAMIENTO  Le recetarn medicamentos que ayudan a [employer].  INSTRUCCIONES PARA EL CUIDADO EN EL HOGAR   Slo tome medicamentos de venta libre o recetados para Glass blower/designer o Health and safety inspector, segn las indicaciones de su mdico.   Cuando sienta dolor de cabeza acustese en un cuarto oscuro y tranquilo.   Lleve un registro diario para Neurosurgeon lo que [employer] dolores de [place]. Por ejemplo, escriba:  Lo que come y bebe.  Cunto tiempo duerme.  Todo cambio en la dieta o medicamentos.  Trate con masajes u otras tcnicas de relajacin.   Pueden utilizarse bolsas de hielo o calor aplicadas a la cabeza o al cuello. selos 3 a 4 veces por da de 15 a 20 minutos por vez, o como sea necesario.   Limite las situaciones de estrs.   Sintese con la espalda recta y no tense los msculos.   Si fuma, deje de hacerlo.  Limite el consumo de bebidas alcohlicas.  Consuma menos cantidad de cafena o deje de tomarla.  Coma y haga ejercicios regularmente.  Duerma entre 7 y 55 horas o como lo indique su mdico.  Evite el uso excesivo de medicamentos para Dispensing optician  el dolor recurrente de cabeza que pueden ocurrir.  SOLICITE ATENCIN MDICA SI:   Tiene problemas con los Haematologist.  El medicamento no le hace efecto.  El dolor de cabeza que senta habitualmente es diferente.  Tiene nuseas o vmitos. SOLICITE ATENCIN MDICA DE INMEDIATO SI:   El dolor se hace cada vez ms intenso.  Tiene fiebre.  Presenta rigidez en el cuello.  Sufre prdida de la visin.  Presenta debilidad muscular o prdida del control muscular.  Pierde  equilibrio o tiene problemas para Writer.  Sufre mareos o se desmaya.  Tiene sntomas graves que son diferentes a los primeros sntomas. ASEGRESE DE QUE:   Comprende estas instrucciones.  Controlar su enfermedad.  Solicitar ayuda de inmediato si no mejora o si empeora. Document Released: 02/15/2005 Document Revised: 07/31/2011 [facility] Patient Information 2015 [place]. This information is not intended to replace advice given to you by your health care provider. Make sure you discuss any questions you have with your health care provider.

## 2013-12-15 NOTE — ED Provider Notes (Addendum)
CSN: [id number]     Arrival date & time 12/15/13  1549 History   First MD Initiated Contact with Patient 12/15/13 1632     Chief Complaint  Patient presents with  . Headache   (Consider location/radiation/quality/duration/timing/severity/associated sxs/prior Treatment) HPI  INTERPRETIVE SERVICES REQUIRED FOR ENTIRE PT ENCOUNTER  HA: started Sat at 1am. Posterior of HA and temples. Woke up from sleep. No improvement. Pressure. comese and goes over the course of hours. Has not taken anything for the HA. No h/o HA. Denies any change in exercise routine or sleeping habits. No caffeine intake. Denies, loss of bowel or bladder function, weakness on one side, SZR, confusion. Pain is worse w/ head movement. Sound sensitivity. Denies photophobia.. No change in medications recently   Past Medical History  Diagnosis Date  . Anxiety   . Depression    Past Surgical History  Procedure Laterality Date  . Tubal ligation    . Cesarean section      3 previous sections   Family History  Problem Relation Age of Onset  . Diabetes Father   . Hypertension Father    History  Substance Use Topics  . Smoking status: Never Smoker   . Smokeless tobacco: Never Used  . Alcohol Use: No   OB History   Grav Para Term Preterm Abortions TAB SAB Ect Mult Living   5 3 3  2  2   3      Review of Systems Per HPI with all other pertinent systems negative.   Allergies  Review of patient's allergies indicates no known allergies.  Home Medications   Prior to Admission medications   Medication Sig Start Date End Date Taking? Authorizing Provider  cyclobenzaprine (FLEXERIL) 5 MG tablet Take 1 tablet (5 mg total) by mouth 3 (three) times daily as needed for muscle spasms. 12/15/13   Waldemar Dickens, MD  HYDROcodone-acetaminophen (NORCO/VICODIN) 5-325 MG per tablet Take 1-2 tablets by mouth every 6 hours as needed for pain. 11/15/13   Nicole Pisciotta, PA-C   BP 125/97  Pulse 69  Temp(Src) 98.4 F (36.9 C)  (Oral)  Resp 18  Ht 5' (1.524 m)  Wt 189 lb (85.73 kg)  BMI 36.91 kg/m2  SpO2 99% Physical Exam  Constitutional: She is oriented to person, place, and time. She appears well-developed and well-nourished. No distress.  HENT:  Head: Normocephalic and atraumatic.  Eyes: EOM are normal.  Neck: Normal range of motion.  Cardiovascular: Normal rate, normal heart sounds and intact distal pulses.  Exam reveals no gallop.   No murmur heard. Pulmonary/Chest: Effort normal and breath sounds normal. No respiratory distress.  Abdominal: Soft.  Musculoskeletal:  Slow but full ROM of neck. Stiffness on palpation but no bony abnormality.  Neurological: She is alert and oriented to person, place, and time. No cranial nerve deficit. She exhibits normal muscle tone. Coordination normal.  Skin: Skin is warm. No rash noted. She is not diaphoretic.  Psychiatric: She has a normal mood and affect. Her behavior is normal. Judgment and thought content normal.    ED Course  Procedures (including critical care time) Labs Review Labs Reviewed - No data to display  Imaging Review No results found.   MDM   1. Tension headache   Toradol 60mg  and benadryl 25mg  Flexeril Rx given Home, rest, massage, ROM exercises Restart NSAIDs in 24 hrs if needed  Tylenol 1000mg  Q8 if needed  Linna Darner, MD Family Medicine 12/15/2013, 5:10 PM      [place] Congress  Marily Memos, MD 12/15/13 [place], MD 12/15/13 [phone]

## 2014-02-04 ENCOUNTER — Other Ambulatory Visit: Payer: Self-pay | Admitting: Orthopedic Surgery

## 2014-02-04 DIAGNOSIS — M5441 Lumbago with sciatica, right side: Secondary | ICD-10-CM

## 2014-02-05 ENCOUNTER — Ambulatory Visit
Admission: RE | Admit: 2014-02-05 | Discharge: 2014-02-05 | Disposition: A | Discharge status: Home / Self Care | Payer: Self-pay | Source: Ambulatory Visit | Attending: Orthopedic Surgery | Admitting: Orthopedic Surgery

## 2014-02-05 ENCOUNTER — Other Ambulatory Visit: Payer: Self-pay | Admitting: Orthopedic Surgery

## 2014-02-05 ENCOUNTER — Inpatient Hospital Stay
Admission: RE | Admit: 2014-02-05 | Discharge: 2014-02-05 | Disposition: A | Discharge status: Home / Self Care | Payer: Self-pay | Source: Ambulatory Visit | Attending: Orthopedic Surgery | Admitting: Orthopedic Surgery

## 2014-02-05 DIAGNOSIS — M5431 Sciatica, right side: Secondary | ICD-10-CM

## 2014-02-05 DIAGNOSIS — M5441 Lumbago with sciatica, right side: Secondary | ICD-10-CM

## 2014-02-05 MED ORDER — DIAZEPAM 5 MG PO TABS
10.0000 mg | ORAL_TABLET | Freq: Once | ORAL | Status: AC
  Administered 2014-02-05: 5 mg via ORAL

## 2014-02-05 MED ORDER — IOHEXOL 180 MG/ML  SOLN
15.0000 mL | Freq: Once | INTRAMUSCULAR | Status: AC | PRN
  Administered 2014-02-05: 15 mL via INTRATHECAL

## 2014-02-05 NOTE — Progress Notes (Signed)
Discharge instructions explained thru the interpreter. Dr. Gerilyn Pilgrim in and talked to pt and explain procedure.

## 2014-02-05 NOTE — Discharge Instructions (Signed)

## 2014-02-12 ENCOUNTER — Other Ambulatory Visit (HOSPITAL_COMMUNITY): Payer: Self-pay | Admitting: *Deleted

## 2014-02-12 DIAGNOSIS — Z09 Encounter for follow-up examination after completed treatment for conditions other than malignant neoplasm: Secondary | ICD-10-CM

## 2014-02-24 ENCOUNTER — Ambulatory Visit
Admission: RE | Admit: 2014-02-24 | Discharge: 2014-02-24 | Disposition: A | Discharge status: Home / Self Care | Payer: No Typology Code available for payment source | Source: Ambulatory Visit | Attending: Obstetrics and Gynecology | Admitting: Obstetrics and Gynecology

## 2014-02-24 ENCOUNTER — Other Ambulatory Visit (HOSPITAL_COMMUNITY): Payer: Self-pay | Admitting: Obstetrics and Gynecology

## 2014-02-24 ENCOUNTER — Encounter (HOSPITAL_COMMUNITY): Payer: Self-pay

## 2014-02-24 ENCOUNTER — Ambulatory Visit (HOSPITAL_COMMUNITY)
Admission: RE | Admit: 2014-02-24 | Discharge: 2014-02-24 | Disposition: A | Discharge status: Home / Self Care | Payer: Self-pay | Source: Ambulatory Visit | Attending: Obstetrics and Gynecology | Admitting: Obstetrics and Gynecology

## 2014-02-24 VITALS — BP 112/68 | Temp 98.2°F | Ht 61.0 in | Wt 202.0 lb

## 2014-02-24 DIAGNOSIS — Z1239 Encounter for other screening for malignant neoplasm of breast: Secondary | ICD-10-CM

## 2014-02-24 DIAGNOSIS — N6320 Unspecified lump in the left breast, unspecified quadrant: Secondary | ICD-10-CM

## 2014-02-24 DIAGNOSIS — Z09 Encounter for follow-up examination after completed treatment for conditions other than malignant neoplasm: Secondary | ICD-10-CM

## 2014-02-24 NOTE — Patient Instructions (Addendum)
Explained to  Alicia Olson that she did not need a Pap smear today due to last Pap smear was in March 2014 per patient. Let her know [facility] will cover Pap smears every 3 years unless has a history of abnormal Pap smears. Referred patient to the [facility] for diagnostic mammogram and left breast ultrasound per recommendation. Appointment scheduled for Tuesday, February 24, 2014 at 1545.  Patient aware of appointment and will be there. Preslee Regas verbalized understanding.  Natlie Asfour, Arvil Chaco, RN 2:55 PM

## 2014-02-24 NOTE — Progress Notes (Signed)
No complaints today. Patient referred to [facility] by the [facility] due to recommending 6 month bilateral diagnostic mammogram and left breast ultrasound.  Pap Smear:  Pap smear not completed today. Last Pap smear was in March 2014 and normal per patient. Per patient has no history of an abnormal Pap smear. No Pap smear results in EPIC.  Physical exam: Breasts Breasts symmetrical. No skin abnormalities bilateral breasts. No nipple retraction bilateral breasts. No nipple discharge bilateral breasts. No lymphadenopathy. No lumps palpated bilateral breasts. No complaints of pain or tenderness on exam. Referred patient to the [facility] for diagnostic mammogram and left breast ultrasound per recommendation. Appointment scheduled for Tuesday, February 24, 2014 at 1545.      Pelvic/Bimanual No Pap smear completed today since last Pap smear was in March 2014. Pap smear not indicated per [facility] guidelines.

## 2014-03-23 ENCOUNTER — Encounter (HOSPITAL_COMMUNITY): Payer: Self-pay

## 2014-04-24 ENCOUNTER — Ambulatory Visit (HOSPITAL_COMMUNITY): Payer: Self-pay | Attending: Emergency Medicine

## 2014-04-24 ENCOUNTER — Encounter (HOSPITAL_COMMUNITY): Payer: Self-pay

## 2014-04-24 ENCOUNTER — Emergency Department (INDEPENDENT_AMBULATORY_CARE_PROVIDER_SITE_OTHER)
Admission: EM | Admit: 2014-04-24 | Discharge: 2014-04-24 | Disposition: A | Discharge status: Home / Self Care | Payer: Self-pay | Source: Home / Self Care | Attending: Family Medicine | Admitting: Family Medicine

## 2014-04-24 DIAGNOSIS — R109 Unspecified abdominal pain: Secondary | ICD-10-CM

## 2014-04-24 DIAGNOSIS — R1032 Left lower quadrant pain: Secondary | ICD-10-CM | POA: Insufficient documentation

## 2014-04-24 DIAGNOSIS — R10819 Abdominal tenderness, unspecified site: Secondary | ICD-10-CM

## 2014-04-24 HISTORY — DX: Asymptomatic menopausal state: Z78.0

## 2014-04-24 LAB — POCT URINALYSIS DIP (DEVICE)
Glucose, UA: NEGATIVE mg/dL
Hgb urine dipstick: NEGATIVE
KETONES UR: NEGATIVE mg/dL
Nitrite: NEGATIVE
PH: 5.5 (ref 5.0–8.0)
PROTEIN: 30 mg/dL — AB
SPECIFIC GRAVITY, URINE: 1.025 (ref 1.005–1.030)
Urobilinogen, UA: 0.2 mg/dL (ref 0.0–1.0)

## 2014-04-24 NOTE — ED Provider Notes (Signed)
CSN: [id number]     Arrival date & time 04/24/14  1033 History   First MD Initiated Contact with Patient 04/24/14 1042     Chief Complaint  Patient presents with  . Flank Pain   (Consider location/radiation/quality/duration/timing/severity/associated sxs/prior Treatment) HPI Comments: 48 year old female presents with a complaint of left hemi-abdominal pain for 2 days. It is constant. Nothing makes it worse. Nothing makes it better. There is no associated fever, constipation or diarrhea. She did have a loose bowel movement a couple days ago and her last normal BM was last PM. She admits to having hard balls of stool with BM. No migration of pain. Denies urinary symptoms. She notes that she had similar pain in the right hemiabdomen 2 weeks ago that lasted for one day.   Past Medical History  Diagnosis Date  . Anxiety   . Depression   . Menopause    Past Surgical History  Procedure Laterality Date  . Tubal ligation    . Cesarean section      3 previous sections   Family History  Problem Relation Age of Onset  . Diabetes Father   . Hypertension Father    History  Substance Use Topics  . Smoking status: Never Smoker   . Smokeless tobacco: Never Used  . Alcohol Use: No   OB History    Gravida Para Term Preterm AB TAB SAB Ectopic Multiple Living   5 3 3  2  2   3      Review of Systems  Constitutional: Negative for fever, activity change and fatigue.  HENT: Negative.   Respiratory: Negative for cough and shortness of breath.   Cardiovascular: Negative for chest pain, palpitations and leg swelling.  Gastrointestinal: Positive for abdominal pain. Negative for nausea, vomiting, diarrhea, constipation, blood in stool and abdominal distention.  Genitourinary: Negative.   Musculoskeletal: Negative.   Neurological: Negative.     Allergies  Review of patient's allergies indicates no known allergies.  Home Medications   Prior to Admission medications   Medication Sig Start  Date End Date Taking? Authorizing Provider  cyclobenzaprine (FLEXERIL) 5 MG tablet Take 1 tablet (5 mg total) by mouth 3 (three) times daily as needed for muscle spasms. 12/15/13   Waldemar Dickens, MD  HYDROcodone-acetaminophen (NORCO/VICODIN) 5-325 MG per tablet Take 1-2 tablets by mouth every 6 hours as needed for pain. 11/15/13   Nicole Pisciotta, PA-C   BP 122/68 mmHg  Pulse 68  Temp(Src) 98 F (36.7 C) (Oral)  Resp 14  SpO2 98% Physical Exam  Constitutional: She is oriented to person, place, and time. She appears well-developed and well-nourished. No distress.  Neck: Normal range of motion. Neck supple.  Cardiovascular: Normal rate, regular rhythm, normal heart sounds and intact distal pulses.   Pulmonary/Chest: Effort normal and breath sounds normal. No respiratory distress. She has no wheezes. She has no rales.  Abdominal: Soft. She exhibits no distension and no mass. There is no rebound and no guarding.  Hypo active BS Abd soft. Tenderness to the entire left hemiabdomen from the left costal margins to the pelvis  Musculoskeletal: Normal range of motion. She exhibits no edema.  Neurological: She is alert and oriented to person, place, and time. She exhibits normal muscle tone.  Skin: Skin is warm and dry.  Psychiatric: She has a normal mood and affect.  Nursing note and vitals reviewed.   ED Course  Procedures (including critical care time) Labs Review Labs Reviewed - No data to display Hemocult  negative Imaging Review Dg Abd 1 View  04/24/2014   CLINICAL DATA:  Acute left lower quadrant abdominal pain.  EXAM: ABDOMEN - 1 VIEW  COMPARISON:  None.  FINDINGS: The bowel gas pattern is normal. Possible small left renal calculus is noted. No other significant abnormality seen.  IMPRESSION: No evidence of bowel obstruction or ileus. Possible small left renal calculus.   Electronically Signed   By: Sabino Dick M.D.   On: 04/24/2014 11:36     MDM   1. Abdominal pain in female    2. Abdominal tenderness     No evidence of surgical abdomen.  Areas of tenderness suggest colonic etiology, possibly gas or mild degree of constipation.  (Due to EPIC problems unable to view the x ray.) X Ray interpretation with small left renal calculus. Doubt this alone would produce the tenderness, nothing noted within ureter and no micro hematuria. For now suggest Miralax trial and lots of water. For worsening, increased pain, bleeding, BM's stop, fever or other problems go to the ED.      Janne Napoleon, NP 04/24/14 1206  Janne Napoleon, NP 04/24/14 [id number]

## 2014-04-24 NOTE — Discharge Instructions (Signed)
Dolor abdominal en las mujeres °(Abdominal Pain, Women) °El dolor abdominal (en el estómago, la pelvis o el vientre) puede tener muchas causas. Es importante que le informe a su médico: °· La ubicación del dolor. °· ¿Viene y se va, o persiste todo el tiempo? °· ¿Hay situaciones que inician el dolor (comer ciertos alimentos, la actividad física)? °· ¿Tiene otros síntomas asociados al dolor (fiebre, náuseas, vómitos, diarrea)? °Todo es de gran ayuda cuando se trata de hallar la causa del dolor. °CAUSAS °· Estómago: Infecciones por virus o bacterias, o úlcera. °· Intestino: Apendicitis (apéndice inflamado), ileitis regional (enfermedad de Crohn), colitis ulcerosa (colon inflamado), síndrome del colon irritable, diverticulitis (inflamación de los divertículos del colon) o cáncer de estómago oo intestino. °· Enfermedades de la vesícula biliar o cálculos. °· Enfermedades renales, cálculos o infecciones en el riñón. °· Infección o cáncer del páncreas. °· Fibromialgia (trastorno doloroso) °· Enfermedades de los órganos femeninos: °¨ Uterus: Útero: fibroma (tumor no canceroso) o infección °¨ Trompas de Falopio: infección o embarazo ectópico °¨ En los ovarios, quistes o tumores. °¨ Adherencias pélvicas (tejido cicatrizal). °¨ Endometriosis (el tejido que cubre el útero se desarrolla en la pelvis y los órganos pélvicos). °¨ Síndrome de congestión pélvica (los órganos femeninos se llenan de sangre antes del periodo menstrual( °¨ Dolor durante el periodo menstrual. °¨ Dolor durante la ovulación (al producir óvulos). °¨ Dolor al usar el DIU (dispositivo intrauterino para el control de la natalidad) °¨ Cáncer en los órganos femeninos. °· Dolor funcional (no está originado en una enfermedad, puede mejorar sin tratamiento). °· Dolor de origen psicológico °· Depresión. °DIAGNÓSTICO °Su médico decidirá la gravedad del dolor a través del examen físico °· Análisis de sangre °· Radiografías °· Ecografías °· TC (tomografía computada, tipo  especial de radiografías). °· IMR (resonancia magnética) °· Cultivos, en el caso una infección °· Colon por enema de bario (se inserta una sustancia de contraste en el intestino grueso para mejorar la observación con rayos X.) °· Colonoscopía (observación del intestino con un tubo luminoso). °· Laparoscopía (examen del interior del abdomen con un tubo que tiene una luz). °· Cirugía exploratoria abdominal mayor (se observa el abdomen realizando una gran incisión). °TRATAMIENTO °El tratamiento dependerá de la causa del problema.  °· Muchos de estos casos pueden controlarse y tratarse en casa. °· Medicamentos de venta libre indicados por el médico. °· Medicamentos con receta. °· Antibióticos, en caso de infección °· Píldoras anticonceptivas, en el caso de períodos dolorosos o dolor al ovular. °· Tratamiento hormonal, para la endometriosis °· Inyecciones para bloqueo nervioso selectivo. °· Fisioterapia. °· Antidepresivos. °· Consejos por parte de un psícólogo o psiquiatra. °· Cirugía mayor o menor. °INSTRUCCIONES PARA EL CUIDADO DOMICILIARIO °· No tome ni administre laxantes a menos que se lo haya indicado su médico. °· Tome analgésicos de venta libre sólo si se lo ha indicado el profesional que lo asiste. No tome aspirina, ya que puede causar molestias en el estómago o hemorragias. °· Consuma una dieta líquida (caldo o agua) según lo indicado por el médico. Progrese lentamente a una dieta blanda, según la tolerancia, si el dolor se relaciona con el estómago o el intestino. °· Tenga un termómetro y tómese la temperatura varias veces al día. °· Haga reposo en la cama y duerma, si esto alivia el dolor. °· Evite las relaciones sexuales, si le producen dolor. °· Evite las situaciones estresantes. °· Cumpla con las visitas y los análisis de control, según las indicaciones de su médico. °· Si el dolor   no se alivia con los medicamentos o la cirugía, puede tratar con: °¨ Acupuntura. °¨ Ejercicios de relajación (yoga,  meditación). °¨ Terapia grupal. °¨ Psicoterapia. °SOLICITE ATENCIÓN MÉDICA SI: °· Nota que ciertos alimentos le producen dolor de estómago. °· El tratamiento indicado para realizar en el hogar no le alivia el dolor. °· Necesita analgésicos más fuertes. °· Quiere que le retiren el DIU. °· Si se siente confundido o desfalleciente. °· Presenta náuseas o vómitos. °· Aparece una erupción cutánea. °· Sufre efectos adversos o una reacción alérgica debido a los medicamentos que toma. °SOLICITE ATENCIÓN MÉDICA DE INMEDIATO SI: °· El dolor persiste o se agrava. °· Tiene fiebre. °· Siente el dolor sólo en algunos sectores del abdomen. Si se localiza en la zona derecha, posiblemente podría tratarse de apendicitis. En un adulto, si se localiza en la región inferior izquierda del abdomen, podría tratarse de colitis o diverticulitis. °· Hay sangre en las heces (deposiciones de color rojo brillante o negro alquitranado), con o sin vómitos. °· Usted presenta sangre en la orina. °· Siente escalofríos con o sin fiebre. °· Se desmaya. °ASEGÚRESE QUE:  °· Comprende estas instrucciones. °· Controlará su enfermedad. °· Solicitará ayuda de inmediato si no mejora o si empeora. °Document Released: 08/24/2008 Document Revised: 07/31/2011 °ExitCare® Patient Information ©2015 [employer]. This information is not intended to replace advice given to you by your health care provider. Make sure you discuss any questions you have with your health care provider. ° °

## 2014-04-24 NOTE — ED Notes (Signed)
C/o had pain in left flank a few days ago, now has developed pain in her right flank . C/o has been having loose BM's, but no diarrhea. No one else in home has been ill. NAD

## 2014-04-28 ENCOUNTER — Emergency Department (INDEPENDENT_AMBULATORY_CARE_PROVIDER_SITE_OTHER)
Admission: EM | Admit: 2014-04-28 | Discharge: 2014-04-28 | Disposition: A | Discharge status: Home / Self Care | Payer: Self-pay | Source: Home / Self Care | Attending: Family Medicine | Admitting: Family Medicine

## 2014-04-28 ENCOUNTER — Encounter (HOSPITAL_COMMUNITY): Payer: Self-pay | Admitting: *Deleted

## 2014-04-28 DIAGNOSIS — J069 Acute upper respiratory infection, unspecified: Secondary | ICD-10-CM

## 2014-04-28 MED ORDER — AZITHROMYCIN 250 MG PO TABS: ORAL_TABLET | ORAL | Status: DC

## 2014-04-28 NOTE — ED Provider Notes (Signed)
CSN: [id number]     Arrival date & time 04/28/14  1358 History   First MD Initiated Contact with Patient 04/28/14 1415     Chief Complaint  Patient presents with  . Cough   (Consider location/radiation/quality/duration/timing/severity/associated sxs/prior Treatment) Patient is a 48 y.o. female presenting with cough. The history is provided by the patient.  Cough Cough characteristics:  Non-productive and dry Severity:  Mild Onset quality:  Gradual Chronicity:  New Context: upper respiratory infection and weather changes   Context: not sick contacts   Associated symptoms: chills and rhinorrhea   Associated symptoms: no fever and no shortness of breath   Associated symptoms comment:  Epistaxis, sinus congestion    Past Medical History  Diagnosis Date  . Anxiety   . Depression   . Menopause    Past Surgical History  Procedure Laterality Date  . Tubal ligation    . Cesarean section      3 previous sections   Family History  Problem Relation Age of Onset  . Diabetes Father   . Hypertension Father    History  Substance Use Topics  . Smoking status: Never Smoker   . Smokeless tobacco: Never Used  . Alcohol Use: No   OB History    Gravida Para Term Preterm AB TAB SAB Ectopic Multiple Living   5 3 3  2  2   3      Review of Systems  Constitutional: Positive for chills. Negative for fever.  HENT: Positive for congestion, nosebleeds, postnasal drip and rhinorrhea.   Eyes: Negative.   Respiratory: Positive for cough. Negative for shortness of breath.   Cardiovascular: Negative.   Gastrointestinal: Negative.     Allergies  Review of patient's allergies indicates no known allergies.  Home Medications   Prior to Admission medications   Medication Sig Start Date End Date Taking? Authorizing Provider  azithromycin (ZITHROMAX Z-PAK) 250 MG tablet Take as directed on pack 04/28/14   Billy Fischer, MD  cyclobenzaprine (FLEXERIL) 5 MG tablet Take 1 tablet (5 mg total) by  mouth 3 (three) times daily as needed for muscle spasms. 12/15/13   Waldemar Dickens, MD  HYDROcodone-acetaminophen (NORCO/VICODIN) 5-325 MG per tablet Take 1-2 tablets by mouth every 6 hours as needed for pain. 11/15/13   Nicole Pisciotta, PA-C   BP 132/86 mmHg  Pulse 66  Temp(Src) 98 F (36.7 C) (Oral)  Resp 16  SpO2 96% Physical Exam  Constitutional: She is oriented to person, place, and time. She appears well-developed and well-nourished.  HENT:  Head: Normocephalic.  Right Ear: External ear normal.  Left Ear: External ear normal.  Mouth/Throat: Uvula is midline and mucous membranes are normal. Posterior oropharyngeal erythema present.  Neck: Normal range of motion. Neck supple.  Cardiovascular: Normal rate, regular rhythm, normal heart sounds and intact distal pulses.   Pulmonary/Chest: Effort normal and breath sounds normal.  Lymphadenopathy:    She has no cervical adenopathy.  Neurological: She is alert and oriented to person, place, and time.  Skin: Skin is warm and dry.  Nursing note and vitals reviewed.   ED Course  Procedures (including critical care time) Labs Review Labs Reviewed - No data to display  Imaging Review No results found.   MDM   1. URI (upper respiratory infection)        Billy Fischer, MD 04/28/14 [phone]

## 2014-04-28 NOTE — ED Notes (Signed)
Pt  Reports  sev  Days  Of  Cough    /  Congestion  /  Body  Aches            With  occasonally      Blood  Tinged  sputom                 symptoms  Not  releived  By  OTC  meds   Such  As    Nyquil    She  Is  Sitting  Upright on  The  Exam table  Speaking in  Complete  sentances  And  Appears  In no  Severe  Distress

## 2014-04-28 NOTE — Discharge Instructions (Signed)
Use saline nasal spray and mucinex and humidifier in bedroom, drink lots of water, see your doctor as needed.

## 2014-05-01 ENCOUNTER — Ambulatory Visit: Payer: Self-pay | Admitting: Family Medicine

## 2014-05-26 ENCOUNTER — Ambulatory Visit: Payer: Self-pay

## 2014-08-03 ENCOUNTER — Encounter (HOSPITAL_COMMUNITY): Payer: Self-pay | Admitting: Emergency Medicine

## 2014-08-03 ENCOUNTER — Other Ambulatory Visit (HOSPITAL_COMMUNITY)
Admission: RE | Admit: 2014-08-03 | Discharge: 2014-08-03 | Disposition: A | Discharge status: Home / Self Care | Payer: Self-pay | Source: Ambulatory Visit | Attending: Family Medicine | Admitting: Family Medicine

## 2014-08-03 ENCOUNTER — Emergency Department (INDEPENDENT_AMBULATORY_CARE_PROVIDER_SITE_OTHER)
Admission: EM | Admit: 2014-08-03 | Discharge: 2014-08-03 | Disposition: A | Discharge status: Home / Self Care | Payer: Self-pay | Source: Home / Self Care | Attending: Family Medicine | Admitting: Family Medicine

## 2014-08-03 DIAGNOSIS — B3731 Acute candidiasis of vulva and vagina: Secondary | ICD-10-CM

## 2014-08-03 DIAGNOSIS — B373 Candidiasis of vulva and vagina: Secondary | ICD-10-CM

## 2014-08-03 DIAGNOSIS — N9089 Other specified noninflammatory disorders of vulva and perineum: Secondary | ICD-10-CM

## 2014-08-03 DIAGNOSIS — Z113 Encounter for screening for infections with a predominantly sexual mode of transmission: Secondary | ICD-10-CM | POA: Insufficient documentation

## 2014-08-03 DIAGNOSIS — N760 Acute vaginitis: Secondary | ICD-10-CM | POA: Insufficient documentation

## 2014-08-03 DIAGNOSIS — Z202 Contact with and (suspected) exposure to infections with a predominantly sexual mode of transmission: Secondary | ICD-10-CM

## 2014-08-03 LAB — POCT URINALYSIS DIP (DEVICE)
Bilirubin Urine: NEGATIVE
GLUCOSE, UA: NEGATIVE mg/dL
HGB URINE DIPSTICK: NEGATIVE
Ketones, ur: NEGATIVE mg/dL
NITRITE: NEGATIVE
PROTEIN: NEGATIVE mg/dL
Specific Gravity, Urine: 1.015 (ref 1.005–1.030)
Urobilinogen, UA: 0.2 mg/dL (ref 0.0–1.0)
pH: 5.5 (ref 5.0–8.0)

## 2014-08-03 LAB — CERVICOVAGINAL ANCILLARY ONLY
Wet Prep (BD Affirm): NEGATIVE
Wet Prep (BD Affirm): NEGATIVE
Wet Prep (BD Affirm): NEGATIVE

## 2014-08-03 MED ORDER — FLUCONAZOLE 150 MG PO TABS: ORAL_TABLET | ORAL | Status: DC

## 2014-08-03 NOTE — ED Provider Notes (Signed)
CSN: [id number]     Arrival date & time 08/03/14  0258 History   First MD Initiated Contact with Patient 08/03/14 0915     Chief Complaint  Patient presents with  . Vaginal Pain   (Consider location/radiation/quality/duration/timing/severity/associated sxs/prior Treatment) HPI Comments: 49 year old female is complaining of a pimple-like lesion on her genitals. She states it itches. He started 2 weeks ago. She believes it as a come larger. Denies vaginal pain or discharge. She also states that her urine has been burning upon voiding for 2 weeks.  Later in the workup the patient states that her husband had left the country and admitted to having sexual relationships with another female. The patient is requesting testing for STDs including HIV.   Past Medical History  Diagnosis Date  . Anxiety   . Depression   . Menopause    Past Surgical History  Procedure Laterality Date  . Tubal ligation    . Cesarean section      3 previous sections   Family History  Problem Relation Age of Onset  . Diabetes Father   . Hypertension Father    History  Substance Use Topics  . Smoking status: Never Smoker   . Smokeless tobacco: Never Used  . Alcohol Use: No   OB History    Gravida Para Term Preterm AB TAB SAB Ectopic Multiple Living   5 3 3  2  2   3      Review of Systems  Gastrointestinal: Negative.   Genitourinary: Positive for dysuria. Negative for frequency and pelvic pain.       Labial itching and small papular lesions to the right labia mucosa.  Musculoskeletal: Negative.   Skin: Negative.   All other systems reviewed and are negative.   Allergies  Review of patient's allergies indicates no known allergies.  Home Medications   Prior to Admission medications   Medication Sig Start Date End Date Taking? Authorizing Provider  cyclobenzaprine (FLEXERIL) 5 MG tablet Take 1 tablet (5 mg total) by mouth 3 (three) times daily as needed for muscle spasms. 12/15/13   Waldemar Dickens,  MD  fluconazole (DIFLUCAN) 150 MG tablet 1 tab po x 1. May repeat in 72 hours if no improvement 08/03/14   Janne Napoleon, NP  HYDROcodone-acetaminophen (NORCO/VICODIN) 5-325 MG per tablet Take 1-2 tablets by mouth every 6 hours as needed for pain. 11/15/13   Nicole Pisciotta, PA-C   BP 119/76 mmHg  Pulse 83  Temp(Src) 98.7 F (37.1 C) (Oral)  Resp 14  SpO2 99% Physical Exam  Constitutional: She appears well-developed and well-nourished. No distress.  Pulmonary/Chest: Effort normal. No respiratory distress.  Genitourinary:  Normal external anatomy of the female genitalia. The mucosa of the labia is erythematous. There are small, firm flash to pain colored raised lesions to the right labial mucosa. They have minor tenderness. There is no vesicle formation. Intravaginal exam reveals no discharge. The cervix is just right of midline and posterior. Small. Os is closed and non-parous. No fluid exuding from the os. No ectocervical lesion seen.  Neurological: She is alert. She exhibits normal muscle tone. Coordination normal.  Skin: Skin is warm and dry.  Psychiatric: She has a normal mood and affect.  Nursing note and vitals reviewed.   ED Course  Procedures (including critical care time) Labs Review Labs Reviewed  POCT URINALYSIS DIP (DEVICE) - Abnormal; Notable for the following:    Leukocytes, UA SMALL (*)    All other components within normal limits  HERPES SIMPLEX VIRUS CULTURE  URINE CULTURE  HIV ANTIBODY (ROUTINE TESTING)  CERVICOVAGINAL ANCILLARY ONLY    Imaging Review No results found.   MDM   1. Vulvovaginal candidiasis   2. Labial lesion   3. Possible exposure to STD     Diflucan 150 mg 2 tablets as directed Cervical and vaginal cytology with viral culture pending Urine culture pending HIV antibody result pending    Janne Napoleon, NP 08/03/14 1012

## 2014-08-03 NOTE — ED Notes (Signed)
Pt has a small bump on vagina  That she states has been there for awhile

## 2014-08-03 NOTE — Discharge Instructions (Signed)
Vulvovaginitis Candidisica (Candidal Vulvovaginitis) La vulvovaginitis candidisica es una infeccin de la vagina y la vulva. La vulva es la piel que rodea la abertura de la vagina. Puede causar picazn y [place] de y alrededor de la vagina.  CUIDADOS EN EL HOGAR  Slo tome medicamentos como lo indique su mdico.  No mantenga relaciones sexuales hasta que la infeccin haya curado o segn le indique el mdico.  Practique sexo seguro.  Informe a su compaero sexual acerca de su infeccin.  No tome duchas vaginales ni use tampones.  Use ropa interior de algodn. No utilice pantalones ni pantimedias ajustados.  Coma yogur. Esto puede ayudar a tratar y [place] infecciones por cndida. SOLICITE AYUDA DE INMEDIATO SI:   Tiene fiebre.  [place], o si no mejora luego de 3 das.  Tiene malestar, irritacin, o picazn en la zona de la vagina o la vulva.  Siente dolor en al [employer].  Comienza a sentir dolor abdominal. ASEGRESE DE QUE:  Comprende estas instrucciones.  Controlar su enfermedad.  Solicitar ayuda de inmediato si no mejora o empeora. Document Released: 06/10/2010 Document Revised: 05/13/2013 [facility] Patient Information 2015 [place]. This information is not intended to replace advice given to you by your health care provider. Make sure you discuss any questions you have with your health care provider.  Verrugas genitales (Genital Warts)  Las verrugas genitales son Ardelia Mems enfermedad de transmisin sexual. Pueden aparecer como pequeos bultos en los tejidos de la zona genital.  CAUSAS Las verrugas genitales ests ocasionadas por un virus denominado VPH (virus del papiloma humano) Es la ms frecuente entre las enfermedades de transmisin sexual e infeccin de los rganos sexuales. Esta infeccin se disemina por Armed forces operational officer sexuales sin proteccin con una persona infectada. Puede  transmitirse practicando sexo vaginal, anal u oral. Muchas personas no saben que estn infectadas. Pueden padecer la infeccin durante aos, y manifestarse pocos o ningn sntoma (problema) Tambin pueden transmitir la infeccin a sus parejas sin saberlo. SNTOMAS  Picazn e irritacin de la zona genital.  Verrugas que sangran.  Relaciones sexuales dolorosas. DIAGNSTICO [employer] se reconocen a simple vista en la vagina, en la vulva, el perineo, el ano y el recto. Algunas pruebas pueden diagnosticar las verrugas genitales:  Papanicolau.  Examen de Truddie Coco de tejido biopsia.  Colposcopa Se trata de un instrumento que aumenta el tamao de lo que se observa al examinar la vagina y el cuello uterino. Al aplicar ciertas soluciones, las clulas de VPH cambian de color. TRATAMIENTO Las verrugas pueden eliminarse del siguiente modo:   Aplicando sustancias qumicas como cantaridina o podofilina.  Nitrgeno lquido congelante (crioterapia).  Inmunoterapia con inyecciones de cndida o tricofiton.  Tratamiento con lser.  Quemarlas a Lawerance Cruel de una sonda con electricidad (electrocauterizacin).  Inyecciones de interfern.  Ciruga. PREVENCIN La vacuna contra el VPH puede ayudar a prevenir la infeccin por VPH que causa verrugas genitales y cncer del cuello uterino. Se recomienda administrar la vacuna a personas de entre 9 y 49 aos de edad. La vacuna puede no funcionar correctamente o no hacer efecto en absoluto si usted ya tiene VPH. Estas vacunas no deben administrarse a mujeres embarazadas. INSTRUCCIONES PARA EL CUIDADO DOMICILIARIO  Es importante seguir las indicaciones del profesional que la asiste. Las verrugas no desaparecern sin Clinical research associate. Generalmente es necesario repetir el tratamiento para eliminarlas. En general, an despus de que las pruebas fsicas de las verrugas hayan desaparecido, el tejido normal subyacente sigue infectado.  No intente tratar las verrugas  genitales con el mismo medicamento que se [place] para las verrugas de las manos. Este tipo de medicamento es muy fuerte y puede quemar la piel de la zona genital, ocasionando ms daos.  Infrmele a sus parejas sexuales con las que haya mantenido relaciones antes del tratamiento, que usted sufre de Warden/ranger. Podran tambin estar infectados y Warden/ranger.  Evite el contacto sexual mientras Probation officer.  No frote o rasque las verrugas. Esta es una infeccin viral y puede diseminarse a otras partes del organismo (auto inoculacin).  Las mujeres que sufren de verrugas genitales debern hacer un control para Manufacturing engineer cervical (Papanicolau) al menos una vez al ao. Este tipo de cncer es de crecimiento lento y puede curarse si se detecta en una etapa temprana. Las posibilidades de Dispensing optician en las mujeres que han sufrido HPV.  Informe a su obstetra en caso de embarazo. Este virus puede transmitirse al tracto respiratorio del beb. Convrselo con el profesional que lo asiste.  Siempre use condones en sus relaciones sexuales. Luego del tratamiento, use preservativos para Higher education careers adviser.  Para la picazn, puede usar medicamentos de venta libre; consulte con su mdico antes de Biomedical scientist. SOLICITE ATENCIN MDICA SI:  La piel de la zona tratada se vuelve roja, se hincha o duele.  Tiene fiebre.  Siente un Pharmacist, hospital.  Siente pequeos bultos (como granos) alrededor de la zona genital.  Scientist, clinical (histocompatibility and immunogenetics) o hemorragias durante las relaciones sexuales. EST SEGURO QUE:   Comprende las instrucciones para el alta mdica.  Controlar su enfermedad.  Solicitar atencin mdica de inmediato segn las indicaciones. Document Released: 02/15/2005 Document Revised: 07/31/2011 [facility] Patient Information 2015 [place]. This information is not intended to replace advice given to you by your health care provider. Make  sure you discuss any questions you have with your health care provider.  Herpes genital (Genital Herpes) El herpes genital es una enfermedad de transmisin sexual. Esto significa que se transmite al Theatre manager relaciones sexuales con una persona infectada. No hay cura para el herpes genital. El tiempo que transcurre entre un episodio y Lexicographer puede ser desde algunos meses [employer]. El virus puede vivir en una persona, pero no ocasionar sntomas (problemas). Esta infeccin puede pasarse a un beb cuando desciende por el canal del parto (vagina). En un recin nacido puede causar daos en el sistema nervioso central y en los ojos, e incluso la [place]. Generalmente el virus que causa el herpes genital es el virus VHS 2. El virus que ocasiona el herpes bucal casi siempre es el VHS 1. El diagnstico (determinar cul es el problema) puede realizarse por medio de un cultivo. SNTOMAS Generalmente los sntomas de dolor y [place] a una semana despus del contacto. Comienza como diminutas ampollas que progresan a pequeas lceras dolorosas que luego forman [place] y se curan despus de [employer]. Afecta la zona genital externa, la vagina, el cuello del tero, el pene, la zona anal, las nalgas y los muslos. Chaves regin de las lceras seca y limpia.  Tome los medicamentos como se le indic. Los medicamentos antivirales pueden acelerar la curacin. Ellos no evitarn las recurrencias ni curarn esta infeccin. Estos medicamentos tambin pueden tomarse para la supresin si se producen recurrencias frecuentes.  Mientras la infeccin est activa, es contagiosa. Evite todos los contactos sexuales durante las infecciones en [place].  Los preservativos pueden ser tiles  para prevenir la diseminacin del virus del herpes.  Practique el sexo seguro.  Lvese las manos cuidadosamente despus de tocar la zona genital.  Evite tocarse los ojos  despus de tocarse la zona genital.  Informe al profesional que lo asiste si tiene herpes genital y [place]. Es su responsabilidad asegurar que en este embarazo el beb llegue a un buen trmino.  Utilice los medicamentos de venta libre o de prescripcin para Conservation officer, historic buildings, Health and safety inspector o la [place], segn se lo indique el profesional que lo asiste. SOLICITE ATENCIN MDICA SI:  Tiene recurrencia de la infeccin.  No responde o no obtiene [employer]  Aparece un nuevo dolor o secreciones que sean diferentes de la infeccin original.  Usted tiene una temperatura oral de ms de 102 F (38.9 C).  Presenta dolor abdominal.  Comienza a sentir dolor en los ojos o signos de infeccin ocular. Document Released: 02/15/2005 Document Revised: 07/31/2011 [facility] Patient Information 2015 [place]. This information is not intended to replace advice given to you by your health care provider. Make sure you discuss any questions you have with your health care provider.  Enfermedades de transmisin sexual (Sexually Transmitted Disease) Una enfermedad de transmisin sexual (ETS) es una enfermedad o infeccin que se contagia de una persona a otra [place]. Sin embargo, las ETS pueden transmitirse en formas no sexuales. Una ETS puede transmitirse a travs de:  Saliva.  Semen.  Sangre.  Mucosidad de la vagina.  Orina. CMO PUEDO DISMINUIR MIS PROBABILIDADES DE CONTRAER UNA ETS?  Use:  Condones de ltex.  Lubricantes a base de agua junto con condones. No utilice vaselina ni aceites.  Protectores bucales. Son pequeos trozos de ltex que se usan como barrera durante el sexo oral.  Evite tener ms de una pareja sexual.  No tenga relaciones sexuales con alguien que tiene otros compaeros sexuales.  No tenga relaciones sexuales con quien no conozca o con quien tenga riesgo de sufrir una ETS.  Evite las prcticas sexuales riesgosas que puedan  lacerar la piel.  No tenga relaciones sexuales si tiene llagas abiertas en la boca o piel.  Evite abusar del alcohol o consumir drogas ilegales. Evite el uso de alcohol y drogas porque pueden afectar su buen juicio.  Evite el sexo oral o anal.  Aplquese las vacunas para el VPH y la hepatitis.  Si tiene riesgo de infectarse por el VIH, se recomienda tomar diariamente un medicamento para evitar la infeccin. Esto se conoce como profilaxis previa a la exposicin. Puede estar en riesgo si:  Es un hombre que tiene sexo con otros hombres.  Tiene atraccin sexual por el sexo opuesto (heterosexual) y tiene relaciones sexuales con ms de una pareja.  Se inyecta drogas.  Tiene relaciones sexuales con alguien que tiene VIH.  Consulte a su mdico para saber si tiene un alto riesgo de infectarse por el VIH. Si comienza a recibir profilaxis previa a la exposicin, primero debe hacerse un anlisis de deteccin del VIH. Hgase anlisis cada 44meses mientras est recibiendo profilaxis previa a la exposicin. QU DEBO HACER SI CREO QUE TENGO UNA ETS?  Consulte al mdico.  Comunique a sus compaeros sexuales que sufre una infeccin. Ellos deben ser evaluados y recibir tratamiento  No tenga relaciones sexuales hasta que el mdico lo autorice. [place]? Solicite ayuda de inmediato si:  Tiene mucho dolor de estmago (abdominal).  Usted es hombre y tiene inflamacin (hinchazn) o dolor en los testculos.  Usted es  mujer y tiene inflamacin en la vagina. Document Released: 10/10/2010 Document Revised: 05/13/2013 [facility] Patient Information 2015 [place]. This information is not intended to replace advice given to you by your health care provider. Make sure you discuss any questions you have with your health care provider.

## 2014-08-04 LAB — CERVICOVAGINAL ANCILLARY ONLY
CHLAMYDIA, DNA PROBE: NEGATIVE
Neisseria Gonorrhea: NEGATIVE

## 2014-08-04 LAB — URINE CULTURE
Colony Count: 9000
Special Requests: NORMAL

## 2014-08-04 LAB — HIV ANTIBODY (ROUTINE TESTING W REFLEX): HIV Screen 4th Generation wRfx: NONREACTIVE

## 2014-08-05 ENCOUNTER — Emergency Department (HOSPITAL_COMMUNITY)
Admission: EM | Admit: 2014-08-05 | Discharge: 2014-08-06 | Disposition: A | Discharge status: Home / Self Care | Payer: Self-pay | Attending: Emergency Medicine | Admitting: Emergency Medicine

## 2014-08-05 ENCOUNTER — Encounter (HOSPITAL_COMMUNITY): Payer: Self-pay | Admitting: Family Medicine

## 2014-08-05 ENCOUNTER — Emergency Department (HOSPITAL_COMMUNITY): Payer: Self-pay

## 2014-08-05 ENCOUNTER — Emergency Department (INDEPENDENT_AMBULATORY_CARE_PROVIDER_SITE_OTHER)
Admission: EM | Admit: 2014-08-05 | Discharge: 2014-08-05 | Disposition: A | Discharge status: Other Acute Inpatient Hospital | Payer: Self-pay | Source: Home / Self Care | Attending: Family Medicine | Admitting: Family Medicine

## 2014-08-05 ENCOUNTER — Encounter (HOSPITAL_COMMUNITY): Payer: Self-pay

## 2014-08-05 DIAGNOSIS — R1013 Epigastric pain: Secondary | ICD-10-CM

## 2014-08-05 DIAGNOSIS — Z8659 Personal history of other mental and behavioral disorders: Secondary | ICD-10-CM | POA: Insufficient documentation

## 2014-08-05 DIAGNOSIS — Z79899 Other long term (current) drug therapy: Secondary | ICD-10-CM | POA: Insufficient documentation

## 2014-08-05 DIAGNOSIS — R1011 Right upper quadrant pain: Secondary | ICD-10-CM

## 2014-08-05 DIAGNOSIS — Z9851 Tubal ligation status: Secondary | ICD-10-CM | POA: Insufficient documentation

## 2014-08-05 DIAGNOSIS — K828 Other specified diseases of gallbladder: Secondary | ICD-10-CM

## 2014-08-05 DIAGNOSIS — R112 Nausea with vomiting, unspecified: Secondary | ICD-10-CM

## 2014-08-05 DIAGNOSIS — K839 Disease of biliary tract, unspecified: Secondary | ICD-10-CM | POA: Insufficient documentation

## 2014-08-05 LAB — COMPREHENSIVE METABOLIC PANEL
ALK PHOS: 66 U/L (ref 39–117)
ALT: 25 U/L (ref 0–35)
AST: 20 U/L (ref 0–37)
Albumin: 3.9 g/dL (ref 3.5–5.2)
Anion gap: 6 (ref 5–15)
BUN: 7 mg/dL (ref 6–23)
CO2: 27 mmol/L (ref 19–32)
Calcium: 8.9 mg/dL (ref 8.4–10.5)
Chloride: 107 mmol/L (ref 96–112)
Creatinine, Ser: 0.62 mg/dL (ref 0.50–1.10)
GFR calc Af Amer: >90 mL/min (ref >90)
GFR calc non Af Amer: >90 mL/min (ref >90)
GLUCOSE: 95 mg/dL (ref 70–99)
POTASSIUM: 4 mmol/L (ref 3.5–5.1)
SODIUM: 140 mmol/L (ref 135–145)
Total Bilirubin: 0.7 mg/dL (ref 0.3–1.2)
Total Protein: 6.9 g/dL (ref 6.0–8.3)

## 2014-08-05 LAB — POCT URINALYSIS DIP (DEVICE)
BILIRUBIN URINE: NEGATIVE
Glucose, UA: NEGATIVE mg/dL
HGB URINE DIPSTICK: NEGATIVE
KETONES UR: NEGATIVE mg/dL
Leukocytes, UA: NEGATIVE
Nitrite: NEGATIVE
Protein, ur: NEGATIVE mg/dL
SPECIFIC GRAVITY, URINE: 1.015 (ref 1.005–1.030)
UROBILINOGEN UA: 0.2 mg/dL (ref 0.0–1.0)
pH: 8.5 — ABNORMAL HIGH (ref 5.0–8.0)

## 2014-08-05 LAB — CBC WITH DIFFERENTIAL/PLATELET
BASOS ABS: 0 10*3/uL (ref 0.0–0.1)
BASOS PCT: 1 % (ref 0–1)
Eosinophils Absolute: 0.1 10*3/uL (ref 0.0–0.7)
Eosinophils Relative: 5 % (ref 0–5)
HEMATOCRIT: 40.4 % (ref 36.0–46.0)
HEMOGLOBIN: 13.5 g/dL (ref 12.0–15.0)
LYMPHS ABS: 1.4 10*3/uL (ref 0.7–4.0)
LYMPHS PCT: 51 % — AB (ref 12–46)
MCH: 29.2 pg (ref 26.0–34.0)
MCHC: 33.4 g/dL (ref 30.0–36.0)
MCV: 87.3 fL (ref 78.0–100.0)
MONO ABS: 0.3 10*3/uL (ref 0.1–1.0)
Monocytes Relative: 13 % — ABNORMAL HIGH (ref 3–12)
NEUTROS ABS: 0.8 10*3/uL — AB (ref 1.7–7.7)
Neutrophils Relative %: 30 % — ABNORMAL LOW (ref 43–77)
Platelets: 173 10*3/uL (ref 150–400)
RBC: 4.63 MIL/uL (ref 3.87–5.11)
RDW: 14.2 % (ref 11.5–15.5)
WBC: 2.6 10*3/uL — AB (ref 4.0–10.5)

## 2014-08-05 LAB — HERPES SIMPLEX VIRUS CULTURE
CULTURE: NOT DETECTED
SPECIAL REQUESTS: NORMAL

## 2014-08-05 LAB — LIPASE, BLOOD: Lipase: 31 U/L (ref 11–59)

## 2014-08-05 MED ORDER — GI COCKTAIL ~~LOC~~
30.0000 mL | Freq: Once | ORAL | Status: AC
  Administered 2014-08-05: 30 mL via ORAL
  Filled 2014-08-05: qty 30

## 2014-08-05 MED ORDER — ONDANSETRON 4 MG PO TBDP
ORAL_TABLET | ORAL | Status: AC
  Filled 2014-08-05: qty 1

## 2014-08-05 MED ORDER — ONDANSETRON 4 MG PO TBDP
4.0000 mg | ORAL_TABLET | Freq: Once | ORAL | Status: AC
  Administered 2014-08-05: 4 mg via ORAL

## 2014-08-05 MED ORDER — MORPHINE SULFATE 4 MG/ML IJ SOLN
4.0000 mg | Freq: Once | INTRAMUSCULAR | Status: AC
  Administered 2014-08-05: 4 mg via INTRAVENOUS
  Filled 2014-08-05: qty 1

## 2014-08-05 MED ORDER — SODIUM CHLORIDE 0.9 % IV BOLUS (SEPSIS)
1000.0000 mL | Freq: Once | INTRAVENOUS | Status: AC
  Administered 2014-08-05: 1000 mL via INTRAVENOUS

## 2014-08-05 MED ORDER — ONDANSETRON HCL 4 MG/2ML IJ SOLN
4.0000 mg | Freq: Once | INTRAMUSCULAR | Status: AC
  Administered 2014-08-05: 4 mg via INTRAVENOUS
  Filled 2014-08-05: qty 2

## 2014-08-05 NOTE — ED Notes (Signed)
Pt here for RUQ pain radiating into her side and back. sts started last night with N,V,D.

## 2014-08-05 NOTE — Discharge Instructions (Signed)
Return to the emergency room with worsening of symptoms, new symptoms or with symptoms that are concerning , especially fevers, abdominal pain in one area, unable to keep down fluids, blood in stool or vomit, severe pain, you feel faint, lightheaded or pass out. Ibuprofen 400mg  (2 tablets 200mg ) every 5-6 hours for 3-5 days. Please call your doctor for a followup appointment within 24-48 hours. When you talk to your doctor please let them know that you were seen in the emergency department and have them acquire all of your records so that they can discuss the findings with you and formulate a treatment plan to fully care for your new and ongoing problems. Read below information and follow recommendations. Dieta con bajo contenido de grasas para la pancreatitis o los trastornos de la vescula (Low-Fat Diet for Pancreatitis or Gallbladder Conditions) Una dieta con bajo contenido de grasas puede ser til si usted tiene pancreatitis o trastornos de la vescula. En estos trastornos, el pncreas y la vescula tienen dificultades para digerir las grasas. Un plan de alimentacin saludable con menos grasas ayudar a que el pncreas y la vescula descansen, y reducir los sntomas. QU DEBO SABER ACERCA DE ESTA [place]?  Consuma una dieta con bajo contenido de [place].  Reduzca el consumo de grasas a menos del 20% al 30% del total de caloras diarias. Esto representa menos de 50a 60gramos de grasas por da.  Recuerde que la dieta debe incluir algo de [place]. Consulte al nutricionista cul debe ser su meta diaria.  Elija alimentos saludables sin grasas o con bajo contenido de grasas. Busque las palabras "sin grasa", "bajo en grasas" o "bajo contenido de grasas".  [employer], mire la etiqueta y elija alimentos con menos de 3gramos de grasa por porcin. Coma solo una porcin.  Evite el alcohol.  No fumar. Si necesita ayuda para dejar de fumar, hable con el mdico.  Haga comidas pequeas y frecuentes en  lugar de 3 comidas abundantes y pesadas. QU ALIMENTOS PUEDO COMER? Cereales Incluya granos y almidones saludables, como papas, pan integral, cereales ricos en fibras y [place] integral. Elija opciones de cereales integrales siempre que sea posible. En los adultos, los cereales integrales deben representar del 45% al 65% de las caloras diarias.  Lambert Mody y verduras Coma muchas frutas y verduras. Las frutas y verduras frescas aportan fibra a la dieta. Carnes y otras fuentes de protenas Coma carnes [place], como pollo y 39. Quite la grasa de la carne antes de cocinarla. Los [place], el pescado y los frijoles son otras fuentes de protenas. En los adultos, estos alimentos deben representar entre el 10% y el 35% de las caloras diarias. [place] y otros productos lcteos con bajo contenido de [place]. Los lcteos aportan grasas y protenas, adems de calcio.  Grasas y [employer] alimentos con alto contenido de grasas, como las comidas fritas, los [place], los productos horneados y las bebidas azucaradas.  [place] condimentos y las salsas cremosas, como la [place], [place] aportar grasa adicional. Piense si necesita usarlos o no, o use pequeas cantidades u opciones con bajo contenido de grasas. QU ALIMENTOS NO ESTN RECOMENDADOS?  Los alimentos con alto contenido de [place], por ejemplo:  Alimentos horneados.  Helados.  Tostadas francesas.  Panecillos dulces.  Pizza.  Pan de queso.  Alimentos rebozados o cubiertos con [place], salsas cremosas o queso.  Comidas fritas.  Postres y bebidas azucaradas.  Alimentos que causan gases o meteorismo Document Released: 05/13/2013 [facility] Patient Information 2015 [place], [place]. This  information is not intended to replace advice given to you by your health care provider. Make sure you discuss any questions you have with your health care provider. ° °

## 2014-08-05 NOTE — ED Provider Notes (Signed)
CSN: [id number]     Arrival date & time 08/05/14  1552 History   First MD Initiated Contact with Patient 08/05/14 1054     Chief Complaint  Patient presents with  . Abdominal Pain   (Consider location/radiation/quality/duration/timing/severity/associated sxs/prior Treatment) HPI Comments: 49 year old female experienced sudden onset of pain in the epigastrium that radiated to the right upper quadrant and right back at 7 PM last night. She had associated nausea and vomiting twice. Denies fever but did have cold chills. She was told some years ago in her native country that she had gallbladder problems. The current level of pain is less than it was last night. It is mild to moderate now.   Past Medical History  Diagnosis Date  . Anxiety   . Depression   . Menopause    Past Surgical History  Procedure Laterality Date  . Tubal ligation    . Cesarean section      3 previous sections   Family History  Problem Relation Age of Onset  . Diabetes Father   . Hypertension Father    History  Substance Use Topics  . Smoking status: Never Smoker   . Smokeless tobacco: Never Used  . Alcohol Use: No   OB History    Gravida Para Term Preterm AB TAB SAB Ectopic Multiple Living   5 3 3  2  2   3      Review of Systems  Constitutional: Positive for activity change and appetite change. Negative for fever.  HENT: Negative.   Respiratory: Negative.   Cardiovascular: Negative for chest pain, palpitations and leg swelling.  Gastrointestinal: Positive for nausea, vomiting and abdominal pain.  Genitourinary: Negative.   Musculoskeletal: Positive for back pain.  Skin: Negative.   Neurological: Negative.     Allergies  Review of patient's allergies indicates no known allergies.  Home Medications   Prior to Admission medications   Medication Sig Start Date End Date Taking? Authorizing Provider  cyclobenzaprine (FLEXERIL) 5 MG tablet Take 1 tablet (5 mg total) by mouth 3 (three) times daily  as needed for muscle spasms. 12/15/13   Waldemar Dickens, MD  fluconazole (DIFLUCAN) 150 MG tablet 1 tab po x 1. May repeat in 72 hours if no improvement 08/03/14   Janne Napoleon, NP  HYDROcodone-acetaminophen (NORCO/VICODIN) 5-325 MG per tablet Take 1-2 tablets by mouth every 6 hours as needed for pain. 11/15/13   Nicole Pisciotta, PA-C   BP 118/78 mmHg  Pulse 55  Temp(Src) 97.7 F (36.5 C) (Oral)  Resp 12  SpO2 100% Physical Exam  Constitutional: She is oriented to person, place, and time. She appears well-developed and well-nourished. No distress.  Cardiovascular: Normal rate, regular rhythm and normal heart sounds.   Pulmonary/Chest: Effort normal and breath sounds normal. No respiratory distress. She has no wheezes. She has no rales.  Abdominal: She exhibits no distension and no mass. There is no rebound and no guarding.  Abdomen obese, soft. Tenderness in the high epigastrium. Tenderness to the right upper quadrant, right lateral abdomen. Equivocal Murphy sign. The patient takes a deep breath and experiences greater pain with deep palpation.  Musculoskeletal: She exhibits no edema or tenderness.  Neurological: She is alert and oriented to person, place, and time.  Skin: Skin is warm and dry.  Psychiatric: She has a normal mood and affect.  Nursing note and vitals reviewed.   ED Course  Procedures (including critical care time) Labs Review Labs Reviewed  POCT URINALYSIS DIP (DEVICE) - Abnormal;  Notable for the following:    pH 8.5 (*)    All other components within normal limits   Results for orders placed or performed during the hospital encounter of 08/05/14  POCT urinalysis dip (device)  Result Value Ref Range   Glucose, UA NEGATIVE NEGATIVE mg/dL   Bilirubin Urine NEGATIVE NEGATIVE   Ketones, ur NEGATIVE NEGATIVE mg/dL   Specific Gravity, Urine 1.015 1.005 - 1.030   Hgb urine dipstick NEGATIVE NEGATIVE   pH 8.5 (H) 5.0 - 8.0   Protein, ur NEGATIVE NEGATIVE mg/dL    Urobilinogen, UA 0.2 0.0 - 1.0 mg/dL   Nitrite NEGATIVE NEGATIVE   Leukocytes, UA NEGATIVE NEGATIVE     Imaging Review No results found.   MDM   1. RUQ abdominal pain    Patient is being transferred to emergency department for evaluation of epigastric and right upper quadrant pain. She has no PCP and no insurance. She is in the process of obtaining an orange card. This is  likely due to  Cholestatic disease. Few options available for timely evaluation and management.  Urinalysis is negative. She was administered Zofran 4 mg by mouth    Janne Napoleon, NP 08/05/14 1138

## 2014-08-05 NOTE — ED Notes (Signed)
Reportedly has developed pain in right flank w radiation to righr abdominal area since last PM. Also c/o nausea, vomiting

## 2014-08-05 NOTE — ED Provider Notes (Signed)
CSN: [id number]     Arrival date & time 08/05/14  1200 History   First MD Initiated Contact with Patient 08/05/14 1510     Chief Complaint  Patient presents with  . Abdominal Pain     (Consider location/radiation/quality/duration/timing/severity/associated sxs/prior Treatment) Patient refused interpreter for history of present illness speaks some [place]. HPI  Alicia Olson is a 49 y.o. female with PMH of diabetes, depression presenting with right upper quadrant abdominal pain radiating to her side that started last night. Patient states pain comes and goes and is unable to describe it. She endorses some nausea as well as 2 episodes of vomiting and some looser stool no blood or hematochezia. She denies any fevers or chills. She denies alleviating or aggravating factors. She states [place] was [place] she was told she had gallbladder problems. She is not taking anything for her symptoms. She has had 3 previous cesarean sections but no other abdominal surgeries. Pain has improved without treatment. No hematuria or urinary symptoms. No pelvic discomfort.   Past Medical History  Diagnosis Date  . Anxiety   . Depression   . Menopause    Past Surgical History  Procedure Laterality Date  . Tubal ligation    . Cesarean section      3 previous sections   Family History  Problem Relation Age of Onset  . Diabetes Father   . Hypertension Father    History  Substance Use Topics  . Smoking status: Never Smoker   . Smokeless tobacco: Never Used  . Alcohol Use: No   OB History    Gravida Para Term Preterm AB TAB SAB Ectopic Multiple Living   5 3 3  2  2   3      Review of Systems 10 Systems reviewed and are negative for acute change except as noted in the HPI.    Allergies  Review of patient's allergies indicates no known allergies.  Home Medications   Prior to Admission medications   Medication Sig Start Date End Date Taking? Authorizing Provider  fluconazole  (DIFLUCAN) 150 MG tablet 1 tab po x 1. May repeat in 72 hours if no improvement 08/03/14  Yes Janne Napoleon, NP  cyclobenzaprine (FLEXERIL) 5 MG tablet Take 1 tablet (5 mg total) by mouth 3 (three) times daily as needed for muscle spasms. 12/15/13   Waldemar Dickens, MD   BP 124/74 mmHg  Pulse 56  Temp(Src) 98.2 F (36.8 C) (Oral)  Resp 18  Ht 5\' 1"  (1.549 m)  Wt 200 lb (90.719 kg)  BMI 37.81 kg/m2  SpO2 99% Physical Exam  Constitutional: She appears well-developed and well-nourished. No distress.  HENT:  Head: Normocephalic and atraumatic.  Mouth/Throat: Oropharynx is clear and moist.  Eyes: Conjunctivae and EOM are normal. Right eye exhibits no discharge. Left eye exhibits no discharge. No scleral icterus.  Cardiovascular: Normal rate and regular rhythm.   Pulmonary/Chest: Effort normal and breath sounds normal. No respiratory distress. She has no wheezes.  Abdominal: Soft. Bowel sounds are normal. She exhibits no distension.  Epigastric and right upper quadrant abdominal tenderness without rebound, rigidity, guarding. Negative Murphy sign. No CVA tenderness bilaterally. Mild tenderness to palpation of right flank.  Neurological: She is alert. She exhibits normal muscle tone. Coordination normal.  Skin: Skin is warm and dry. She is not diaphoretic.  Nursing note and vitals reviewed.   ED Course  Procedures (including critical care time) Labs Review Labs Reviewed  CBC WITH DIFFERENTIAL/PLATELET - Abnormal;  Notable for the following:    WBC 2.6 (*)    Neutrophils Relative % 30 (*)    Lymphocytes Relative 51 (*)    Monocytes Relative 13 (*)    Neutro Abs 0.8 (*)    All other components within normal limits  COMPREHENSIVE METABOLIC PANEL  LIPASE, BLOOD    Imaging Review US Abdomen Complete  08/05/2014   CLINICAL DATA:  Midline abdominal pain for 1 day with vomiting  EXAM: ULTRASOUND ABDOMEN COMPLETE  COMPARISON:  04/29/2011  FINDINGS: Gallbladder: Layering echoes in the  gallbladder neck compatible with sludge. No gallstone. No wall thickening or focal tenderness.  Common bile duct: Diameter: 2 mm  Liver: Echogenic liver with sparing centrally. No focal lesion. Antegrade flow in the imaged portal venous system.  IVC: No abnormality visualized.  Pancreas: Visualized portion unremarkable.  Spleen: Size and appearance within normal limits.  Right Kidney: Length: 13 cm (suspect over estimation due to obliquity). Echogenicity within normal limits. No mass or hydronephrosis visualized.  Left Kidney: Length: 11 cm. Echogenicity within normal limits. No mass or hydronephrosis visualized.  Abdominal aorta: No aneurysm visualized.  Other findings: None.  IMPRESSION: 1. Gallbladder sludge.  No gallstone or acute cholecystitis. 2. Hepatic steatosis.   Electronically Signed   By: Monte Fantasia M.D.   On: 08/05/2014 17:21   Dg Abd Acute W/chest  08/05/2014   CLINICAL DATA:  Flank pain, right-sided, with nausea and vomiting  EXAM: ACUTE ABDOMEN SERIES (ABDOMEN 2 VIEW & CHEST 1 VIEW)  COMPARISON:  Abdomen radiograph April 24, 2014  FINDINGS: PA chest: There is mild lateral left base atelectasis. Lungs are otherwise clear. Heart size and pulmonary vascularity are normal. No adenopathy.  Supine and upright abdomen: There is moderate stool in the colon. There is no bowel dilatation or air-fluid level suggesting obstruction. No free air. No abnormal calcifications are identified.  IMPRESSION: Slight atelectasis left base laterally. Bowel gas pattern overall unremarkable without obstruction or free air. No abnormal calcifications.   Electronically Signed   By: Lowella Grip III M.D.   On: 08/05/2014 17:30     EKG Interpretation None      MDM   Final diagnoses:  Gallbladder sludge  Nausea and vomiting, vomiting of unspecified type  RUQ pain  Epigastric pain   Patient presenting with right upper quadrant abdominal tenderness as well as epigastric tenderness for 1 day as well as  associated nausea, vomiting. VSS. Patient with right upper quadrant epigastric tenderness without Murphy sign. No evidence of peritonitis. Even her or leukocytosis. No jaundice. No increased LFTs. Acute abdominal series without evidence of obstruction. Ultrasound reveals gallbladder sludge but no common bile duct dilation or gallstones. Patient reports improvement with GI cocktail and on repeat abdominal exam patient with nonsurgical abdomen. Patient educated on low-fat diet, ibuprofen, return precautions and is to follow-up with her primary care. Phone interpreter used to convey these results and instructions. Patient in no acute distress and is stable for discharge and further outpatient management.  Discussed return precautions with patient. Discussed all results and patient verbalizes understanding and agrees with plan.  Case has been discussed with Dr. Eulis Foster who agrees with the above plan and to discharge.    Al Corpus, PA-C 08/05/14 [place], MD 08/06/14 [id number]

## 2014-08-07 LAB — PATHOLOGIST SMEAR REVIEW

## 2014-12-04 ENCOUNTER — Ambulatory Visit: Payer: Self-pay | Attending: Internal Medicine

## 2015-02-26 ENCOUNTER — Other Ambulatory Visit: Payer: Self-pay | Admitting: Obstetrics and Gynecology

## 2015-02-26 DIAGNOSIS — N6320 Unspecified lump in the left breast, unspecified quadrant: Secondary | ICD-10-CM

## 2015-03-02 ENCOUNTER — Other Ambulatory Visit (HOSPITAL_COMMUNITY): Payer: Self-pay | Admitting: *Deleted

## 2015-03-02 DIAGNOSIS — N6002 Solitary cyst of left breast: Secondary | ICD-10-CM

## 2015-03-05 ENCOUNTER — Ambulatory Visit
Admission: RE | Admit: 2015-03-05 | Discharge: 2015-03-05 | Disposition: A | Discharge status: Home / Self Care | Payer: No Typology Code available for payment source | Source: Ambulatory Visit | Attending: Obstetrics and Gynecology | Admitting: Obstetrics and Gynecology

## 2015-03-05 ENCOUNTER — Ambulatory Visit (HOSPITAL_COMMUNITY)
Admission: RE | Admit: 2015-03-05 | Discharge: 2015-03-05 | Disposition: A | Discharge status: Home / Self Care | Payer: Self-pay | Source: Ambulatory Visit | Attending: Obstetrics and Gynecology | Admitting: Obstetrics and Gynecology

## 2015-03-05 ENCOUNTER — Encounter (HOSPITAL_COMMUNITY): Payer: Self-pay

## 2015-03-05 VITALS — BP 114/80 | Temp 97.8°F | Ht 60.0 in | Wt 182.0 lb

## 2015-03-05 DIAGNOSIS — N6002 Solitary cyst of left breast: Secondary | ICD-10-CM

## 2015-03-05 DIAGNOSIS — Z1239 Encounter for other screening for malignant neoplasm of breast: Secondary | ICD-10-CM

## 2015-03-05 DIAGNOSIS — N6320 Unspecified lump in the left breast, unspecified quadrant: Secondary | ICD-10-CM

## 2015-03-05 NOTE — Progress Notes (Signed)
No complaints today. Patient referred to [facility] by the [facility] due to recommending 1 year bilateral diagnostic mammogram and left breast ultrasound. Patient complained of a pulsing feeling with her   Pap Smear: Pap smear not completed today. Last Pap smear was in March 2014 at Dr. Milinda Cave office and normal per patient. Per patient has no history of an abnormal Pap smear. No Pap smear results in EPIC.  Physical exam: Breasts Breasts symmetrical. No skin abnormalities bilateral breasts. No nipple retraction bilateral breasts. No nipple discharge bilateral breasts. No lymphadenopathy. No lumps palpated bilateral breasts. No complaints of pain or tenderness on exam. Referred patient to the [facility] for diagnostic mammogram and left breast ultrasound per recommendation. Appointment scheduled for Tuesday, February 24, 2014 at 1545.   Pelvic/Bimanual No Pap smear completed today since last Pap smear was in March 2014. Pap smear not indicated per [facility] guidelines.

## 2015-03-05 NOTE — Patient Instructions (Signed)
Educational materials on self breast awareness given. Explained to Alicia Olson that she did not need a Pap smear today due to last Pap smear was in March 2014 per patient. Let her know [facility] will cover Pap smears every 3 years unless has a history of abnormal Pap smears. Let her know that her next Pap smear is due next March 2017. Referred patient to the [facility] for diagnostic mammogram and left breast ultrasound per recommendation. Appointment scheduled for Tuesday, February 24, 2014 at 1545. Patient aware of appointment and will be there. [name] verbalized understanding.  Henchy Mccauley, Arvil Chaco, RN 12:10 PM

## 2015-05-14 ENCOUNTER — Ambulatory Visit: Payer: No Typology Code available for payment source | Attending: Internal Medicine

## 2015-07-15 ENCOUNTER — Other Ambulatory Visit: Payer: Self-pay

## 2015-07-15 ENCOUNTER — Ambulatory Visit: Payer: Self-pay

## 2015-07-15 VITALS — BP 120/88 | HR 64 | Temp 98.2°F | Resp 14 | Ht 61.0 in | Wt 199.0 lb

## 2015-07-15 DIAGNOSIS — Z0000 Encounter for general adult medical examination without abnormal findings: Secondary | ICD-10-CM

## 2015-07-15 LAB — LIPID PANEL
CHOLESTEROL TOTAL: 220 mg/dL — AB (ref 100–199)
Chol/HDL Ratio: 5.6 ratio units — ABNORMAL HIGH (ref 0.0–4.4)
HDL: 39 mg/dL — AB (ref >39)
LDL Calculated: 153 mg/dL — ABNORMAL HIGH (ref 0–99)
Triglycerides: 138 mg/dL (ref 0–149)
VLDL CHOLESTEROL CAL: 28 mg/dL (ref 5–40)

## 2015-07-15 NOTE — Progress Notes (Signed)
Patient is returning as a re screen to the [facility] with interpreter Lavon Paganini and is currently a [facility] patient effective 12/28/2014.  Clinical Measurements: Patient is 5 ft. 1 inches, weight 196 lbs, BMI 37.7 .   Medical History: Patient has no history of high cholesterol. Patient does not have a history of hypertension or diabetes. Per patient no diagnosed history of coronary heart disease, heart attack, heart failure, stroke/TIA, vascular disease or congenital heart defects.   Blood Pressure, Self-measurement: Patient states has no reason to check Blood pressure  Nutrition Assessment: Patient stated that eats 0 fruits every day. Patient states she eats 2 servings of vegetables a day. Per patient states does eat 3 or more ounces or more of whole grains daily. Patient stated does not eat two or more servings of fish weekly. Patient states she does not drink more than 36 ounces or 450 calories of beverages with added sugars weekly. Patient stated she does not watch her salt intake.  Physical Activity Assessment: Patient stated that is not doing any moderate exercise or vigorous exercise. States that she watches TV.  Smoking Status: Patient has never smoked and is not exposed to smoke.   Quality of Life Assessment: In assessing patient's quality of life she stated that out of the past 30 days that she has felt her health was good all of them. Patient also stated that in the past 30 days that her mental health was not good including stress, depression and problems with emotions for all days. Patient did state that out of the past 30 days she felt her physical or mental health had not kept her from doing her usual activities including self-care, work or recreation.   Plan: Lab work was done today including a lipid panel and Hgb A1C. Will call lab results when they are finished. Will discuss risk reduction health coaching when call results,

## 2015-07-15 NOTE — Patient Instructions (Signed)
Discussed health assessment with patient.She will be called with results of lab work and we will then discussed any further follow up the patient needs. Patient verbalized understanding. 

## 2015-07-16 ENCOUNTER — Telehealth: Payer: Self-pay

## 2015-07-16 LAB — HEMOGLOBIN A1C
Est. average glucose Bld gHb Est-mCnc: 128 mg/dL
HEMOGLOBIN A1C: 6.1 % — AB (ref 4.8–5.6)

## 2015-07-16 NOTE — Telephone Encounter (Signed)
Called times two. Talked with daughter first and stated that would call back. Second time left voice mail to return call.

## 2015-07-19 ENCOUNTER — Telehealth: Payer: Self-pay

## 2015-07-19 NOTE — Telephone Encounter (Signed)
Called and talked with daughter. Daughter stated that her mother was not there. Left my telephone number with daughter for Alicia Olson to call me back.

## 2015-07-20 ENCOUNTER — Telehealth: Payer: Self-pay

## 2015-07-20 NOTE — Telephone Encounter (Signed)
Calling to give lab results and was no answer with no voice mail.

## 2015-07-21 ENCOUNTER — Telehealth: Payer: Self-pay

## 2015-07-21 NOTE — Telephone Encounter (Signed)
Called to inform about lab work from 07/15/15. Alicia Olson informed patient: cholesterol- 220, HDL- 39, LDL- 153, triglycerides - 138,  HBG-A1C - 6.1, and BMI 37.7 (Obese). Told patient of appointment at [facility] on Thursday, March 2 at 10 AM for elevated cholesterol.  Facility address and telephone number given. Patient was informed not to let doctor do any other lab work or injections unless her eligibility is up to date. [facility] does not cover and patient would be billed.   RISK REDUCTION HEALTH COACHING: Did risk reduction Health Coaching concerning BMI/Weight. Discussed briefly exercise, no sugary foods, decrease number starches per day. Patient wanted to come in for health coaching to learn more about what needs to do. Scheduled appointment for March 9 at 3 PM.  NAVIGATION NEEDS ASSESSMENT AND CARE PLAN: Patient does need additional social support due to loss job last year and is unemployed and depressed.Patient does lack access to services in that has not used services in over a year even though had done eligibility. Patient understands why she has to be followed up for lab results. Another barrier may have been that patient is not aware of some services to assist the low income or maybe embarrassed .  PLAN: Patient will attend doctor's appointment. Will call patient back after appointment to see how appointment went. Patient and I will discuss more health coaching concerning patients exercise and weight loss goals when comes for health coaching.

## 2015-07-22 ENCOUNTER — Ambulatory Visit: Payer: Self-pay | Attending: Internal Medicine | Admitting: Internal Medicine

## 2015-07-22 ENCOUNTER — Encounter (HOSPITAL_COMMUNITY): Payer: Self-pay

## 2015-07-22 ENCOUNTER — Encounter: Payer: Self-pay | Admitting: Internal Medicine

## 2015-07-22 ENCOUNTER — Ambulatory Visit (HOSPITAL_COMMUNITY)
Admission: RE | Admit: 2015-07-22 | Discharge: 2015-07-22 | Disposition: A | Discharge status: Home / Self Care | Payer: No Typology Code available for payment source | Source: Ambulatory Visit | Attending: Obstetrics and Gynecology | Admitting: Obstetrics and Gynecology

## 2015-07-22 VITALS — BP 123/80 | HR 59 | Temp 98.0°F | Resp 16 | Ht 60.0 in | Wt 196.0 lb

## 2015-07-22 VITALS — BP 116/72 | Temp 98.2°F | Ht 60.0 in | Wt 196.0 lb

## 2015-07-22 DIAGNOSIS — D229 Melanocytic nevi, unspecified: Secondary | ICD-10-CM | POA: Insufficient documentation

## 2015-07-22 DIAGNOSIS — Z0000 Encounter for general adult medical examination without abnormal findings: Secondary | ICD-10-CM

## 2015-07-22 DIAGNOSIS — E785 Hyperlipidemia, unspecified: Secondary | ICD-10-CM | POA: Insufficient documentation

## 2015-07-22 DIAGNOSIS — Z79899 Other long term (current) drug therapy: Secondary | ICD-10-CM | POA: Insufficient documentation

## 2015-07-22 DIAGNOSIS — Z01419 Encounter for gynecological examination (general) (routine) without abnormal findings: Secondary | ICD-10-CM

## 2015-07-22 DIAGNOSIS — M546 Pain in thoracic spine: Secondary | ICD-10-CM | POA: Insufficient documentation

## 2015-07-22 DIAGNOSIS — F329 Major depressive disorder, single episode, unspecified: Secondary | ICD-10-CM | POA: Insufficient documentation

## 2015-07-22 DIAGNOSIS — F419 Anxiety disorder, unspecified: Secondary | ICD-10-CM | POA: Insufficient documentation

## 2015-07-22 DIAGNOSIS — Z780 Asymptomatic menopausal state: Secondary | ICD-10-CM | POA: Insufficient documentation

## 2015-07-22 LAB — POCT GLYCOSYLATED HEMOGLOBIN (HGB A1C): HEMOGLOBIN A1C: 5.5

## 2015-07-22 MED ORDER — NAPROXEN 500 MG PO TABS: 500.0000 mg | ORAL_TABLET | Freq: Two times a day (BID) | ORAL | Status: AC

## 2015-07-22 MED ORDER — LOVASTATIN 10 MG PO TABS: 10.0000 mg | ORAL_TABLET | Freq: Every day | ORAL | Status: AC

## 2015-07-22 MED FILL — NAPROXEN 500 MG TABLET: 500 | 30 days supply | Qty: 60 | Fill #0

## 2015-07-22 MED FILL — ?lovastatin 10 MG TABLET: 10 | 30 days supply | Qty: 30 | Fill #0

## 2015-07-22 NOTE — Addendum Note (Signed)
Encounter addended by: Armond Hang, LPN on: [id number] AM<BR>     Documentation filed: [employer], Orders

## 2015-07-22 NOTE — Progress Notes (Signed)
Patient ID: Alicia Olson, female   DOB: 1966/02/06, 50 y.o.   MRN: [id number]   CC: Follow up  HPI: Alicia Olson is a 50 y.o. female here today for a follow up visit.  Patient has past medical history of no past medical history.  Patient had lab work completed at the [facility] clinic showing hyperlipidemia and elevated A1c.  Patient denies any polyuria or polydipsia.   Patient reports an achy, constant pain to midline thoracic area that has been going on for 4 months.  Patient denies any injury.  Patient reports taking Naproxen two times and using hot/cold compresses with some improvement.    Patient has an dark, flat area with uneven borders to first finger on left hand.  Patient reports that the area started on a small dot but has grown over the past 10 years.  Patient denies any pain or injury.   Patient has No headache, No chest pain, No abdominal pain - No Nausea, No new weakness tingling or numbness, No Cough - SOB.  No Known Allergies Past Medical History  Diagnosis Date  . Anxiety   . Depression   . Menopause    Current Outpatient Prescriptions on File Prior to Visit  Medication Sig Dispense Refill  . cyclobenzaprine (FLEXERIL) 5 MG tablet Take 1 tablet (5 mg total) by mouth 3 (three) times daily as needed for muscle spasms. (Patient not taking: Reported on 03/05/2015) 30 tablet 0   No current facility-administered medications on file prior to visit.   Family History  Problem Relation Age of Onset  . Diabetes Father   . Hypertension Father    Social History   Social History  . Marital Status: Single    Spouse Name: N/A  . Number of Children: N/A  . Years of Education: N/A   Occupational History  . Not on file.   Social History Main Topics  . Smoking status: Never Smoker   . Smokeless tobacco: Never Used  . Alcohol Use: No  . Drug Use: No  . Sexual Activity: Yes    Birth Control/ Protection: Surgical   Other Topics Concern  . Not on file   Social  History Narrative    Review of Systems: Constitutional: Negative for fever, chills, diaphoresis, activity change, appetite change and fatigue. HENT: Negative for ear pain, nosebleeds, congestion, facial swelling, rhinorrhea, neck pain, neck stiffness and ear discharge.  Eyes: Negative for pain, discharge, redness, itching and visual disturbance. Respiratory: Negative for cough, choking, chest tightness, shortness of breath, wheezing and stridor.  Cardiovascular: Negative for chest pain, palpitations and leg swelling. Musculoskeletal: midline thoracic back pain Negative for joint swelling, arthralgias and gait problem. Neurological: Negative for dizziness, tremors, seizures, syncope, facial asymmetry, speech difficulty, weakness, light-headedness, numbness and headaches.  Skin:  Dark area on first finger  Objective:   Filed Vitals:   07/22/15 1018  BP: 123/80  Pulse: 59  Temp: 98 F (36.7 C)  Resp: 16    Physical Exam: Constitutional: Patient appears well-developed and well-nourished. No distress. HENT: Normocephalic, atraumatic, External right and left ear normal. Oropharynx is clear and moist.  Eyes: Conjunctivae and EOM are normal. PERRLA, no scleral icterus. Neck: Normal ROM. Neck supple. No JVD. No tracheal deviation. No thyromegaly. CVS: RRR, S1/S2 +, no murmurs, no gallops, no carotid bruit.  Pulmonary: Effort and breath sounds normal, no stridor, rhonchi, wheezes, rales.  Musculoskeletal: Normal range of motion. No edema and no tenderness.  Neuro: Alert.  Skin: Skin is warm and dry.  No rash noted. Not diaphoretic. No erythema. No pallor.  Lab Results  Component Value Date   WBC 2.6* 08/05/2014   HGB 13.5 08/05/2014   HCT 40.4 08/05/2014   MCV 87.3 08/05/2014   PLT 173 08/05/2014   Lab Results  Component Value Date   CREATININE 0.62 08/05/2014   BUN 7 08/05/2014   NA 140 08/05/2014   K 4.0 08/05/2014   CL 107 08/05/2014   CO2 27 08/05/2014    Lab Results   Component Value Date   HGBA1C 5.5 07/22/2015   Lipid Panel     Component Value Date/Time   CHOL 220* 07/15/2015 1015   CHOL 186 10/10/2013 1203   TRIG 138 07/15/2015 1015   TRIG 79 10/10/2013 1203   HDL 39* 07/15/2015 1015   HDL 44 10/10/2013 1203   CHOLHDL 5.6* 07/15/2015 1015   CHOLHDL 4.2 10/10/2013 1203   VLDL 16 10/10/2013 1203   LDLCALC 153* 07/15/2015 1015   LDLCALC 126* 10/10/2013 1203       Assessment and plan:   Alicia Olson was seen today for follow-up.  Diagnoses and all orders for this visit:  HLD (hyperlipidemia) -     lovastatin (MEVACOR) 10 MG tablet; Take 1 tablet (10 mg total) by mouth at bedtime. Framingham scale: 1 risk factor (lower risk).  Patient started on low dose lovastatin.  Will recheck in 6 months.  Patient educated on lifestyle modifications such as diet and exercise.    Midline thoracic back pain -     naproxen (NAPROSYN) 500 MG tablet; Take 1 tablet (500 mg total) by mouth 2 (two) times daily with a meal. Patient educated to use heat and exercise back muscles.  Will follow up in 6 weeks if symptoms worsen.  Nevus -     Ambulatory referral to Dermatology  Healthcare maintenance -     HgB A1c         Melissa Noon, Student NP [facility] [phone] 07/22/2015, 11:53 AM

## 2015-07-22 NOTE — Addendum Note (Signed)
Encounter addended by: Loletta Parish, RN on: 07/22/2015 12:45 PM<BR>     Documentation filed: Notes Section

## 2015-07-22 NOTE — Progress Notes (Addendum)
No complaints today.   Pap Smear: Pap smear completed today. Last Pap smear was in March 2014 at Dr. Milinda Cave office and normal per patient. Per patient has no history of an abnormal Pap smear. No Pap smear results in EPIC.  Pelvic/Bimanual   Ext Genitalia No lesions, no swelling and no discharge observed on external genitalia.         Vagina Vagina pink and normal texture. No lesions or discharge observed in vagina.          Cervix Cervix is present. Cervix pink and of normal texture. No discharge observed.     Uterus Uterus is present and palpable. Uterus in normal position and normal size.        Adnexae Bilateral ovaries present and palpable. No tenderness on palpation.          Rectovaginal No rectal exam completed today since patient had no rectal complaints. No skin abnormalities observed on exam.    Smoking History: Patient has never smoked.  Patient Navigation: Patient education provided. Access to services provided for patient through [facility] program. Spanish interpreter provided.  Used Spanish interpreter [employer].

## 2015-07-22 NOTE — Progress Notes (Signed)
[facility] Was referred here to have her cholesterol and her A!C checked She was told she was pre diabetic

## 2015-07-22 NOTE — Patient Instructions (Signed)
Explained to Alicia Olson that [place] will cover Pap smears and HPV typing every 5 years unless has a history of abnormal Pap smears. Reminded patient that she will need a screening mammogram in October 2017. Let patient know will follow up with her within the next couple weeks with results of Pap smear by phone. Zariyah Lerman verbalized understanding.  Kahliya Fraleigh, Arvil Chaco, RN 11:06 AM

## 2015-07-23 LAB — CYTOLOGY - PAP

## 2015-07-26 ENCOUNTER — Telehealth (HOSPITAL_COMMUNITY): Payer: Self-pay | Admitting: *Deleted

## 2015-07-26 NOTE — Telephone Encounter (Signed)
Telephoned patient at home # and discussed negative pap smear results. HPV was negative. Next pap smear due in 5 years. Patient voiced understanding. Used interpreter Zenda Alpers.

## 2015-08-05 ENCOUNTER — Ambulatory Visit: Payer: Self-pay

## 2015-08-05 VITALS — Ht 60.0 in | Wt 194.0 lb

## 2015-08-05 DIAGNOSIS — Z789 Other specified health status: Secondary | ICD-10-CM

## 2015-08-05 NOTE — Progress Notes (Signed)
[place] Patient returns today for Health Coaching. Virtual Interpreter present [place]. Patient coming mainly because all her labs were abnormal and had not understood a lot about pre diabetes.   HEALTH COACHING:  Patient and I went over lab results and looked at normal range and compared to last set labs over a year ago. Patient main problem is that she loves bread and torilla's. Had patient pull out pictures of all foods that eat at meal time. Patient showed serving size with measuring cup. Went over different serving sizes. Patient received handouts in spanish with the first talking about portion sizes for each food group. We then looked at 1400 to 1600 calorie diet and how many portions can have in each food group. Discussed different thing could have that were low calorie for snacks. Discussed that start with certain serving sizes of carbohydrate and slowly decrease until reach proper number of servings per day. Patient received handouts and handouts were reviewed on portions, fiber, food groups and reading labels. We went over using our Plate method and patient received plate with portions marked. Patient received bag to carry all materials home.   Discussed the need to get in some activity as the weather warms up like walking.Patient stated that would increase exercise. PLAN: Will make appointment for April 13. . Will slowly decrease portion sizes per starch group.

## 2015-08-26 ENCOUNTER — Telehealth: Payer: Self-pay

## 2015-08-26 NOTE — Telephone Encounter (Addendum)
[place] Patient called per phone today for Health Coaching with interpreter Anastasio Auerbach present. Patient's areas of concern were weight, bread, sugar and exercise.   HEALTH COACHING:  Patient stated had decreased the amount of bread she is eating. Per patient has started reading labels regarding calories, carbohydrates and sugar. States that is doing better with amount of sugar is consuming and grease/oils. Patient stated that was walking 30 minutes a day on most days. Per patient feels is loosing weight and doing good.  PLAN: Will Call in 3 to 4 weeks. Will continue with increasing exercise. Will continue with the good meal and nutrition changes.

## 2015-10-05 ENCOUNTER — Telehealth: Payer: Self-pay

## 2015-10-05 NOTE — Telephone Encounter (Signed)
Patient called per Alicia Olson interpreter for final assessment.  ASSESSMENT :This is a follow up Assessment following [place] . Marland Kitchen  Medication Status : Patient states is not taking medication for hypertension or diabetes. Patient stated that is taking medicine for high cholesterol.   Blood Pressure, Self-measurement: Patient states has no reason to check Blood pressure.  Nutrition Assessment: Patient stated that eats no fruit. Per patient states does not like fruit. Patient states she eats 1 serving of vegetables a day. Per Patient eats 3 or more ounces of whole grains daily. Patient stated does eat two or more servings of fish weekly.  Patient states she does not drink more than 36 ounces or 450 calories of beverages with added sugars weekly. Patient stated she does watch her salt intake.   Physical Activity Assessment: Patient stated she does around 300 minutes of moderate activity and 300 minutes vigorous per week.  Smoking Status: Patient stated has never smoked and is not exposed to smoke.  Quality of Life Assessment: In assessing patient's Physical quality of life she stated that out of the past 30 days that she has felt her health is good all of them. Patient also stated that in the past 30 days that her mental health was good including stress, depression and problems with emotions for all days. Patient did state that out of the past 30 days she felt her physical or mental health had not kept her from doing her usual activities including self-care, work or recreation.   PLAN: Informed patient that would re screen if does not have insurance when and if she returns to [facility] next year.  TIME SPENT with PATIENT: 15 minutes

## 2015-10-06 IMAGING — CR DG ABDOMEN ACUTE W/ 1V CHEST
4 series · 4 of 4 positions shown · non-contrast
Comparison: Abdomen radiograph April 24, 2014

CLINICAL DATA: Flank pain, right-sided, with nausea and vomiting

EXAM:
ACUTE ABDOMEN SERIES (ABDOMEN 2 VIEW & CHEST 1 VIEW)

[chest pa]
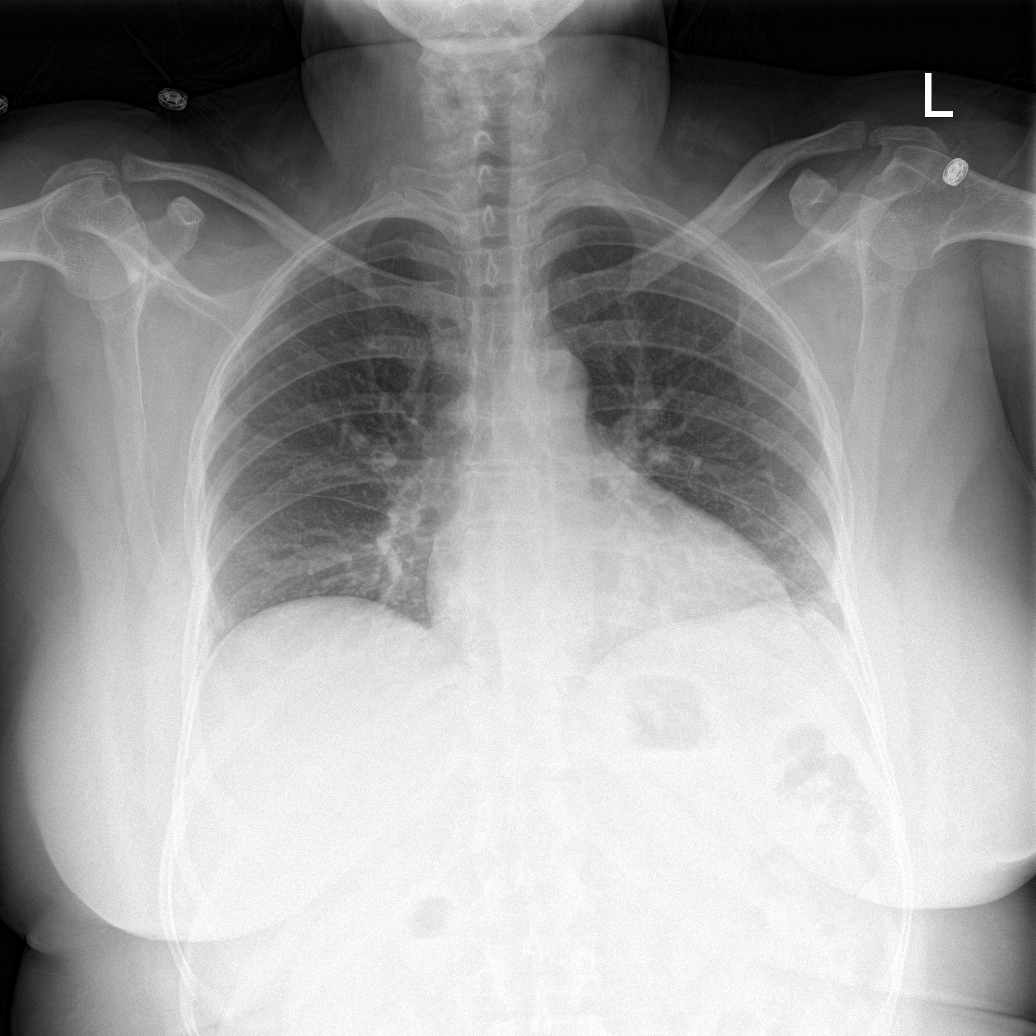

[abdomen erect]
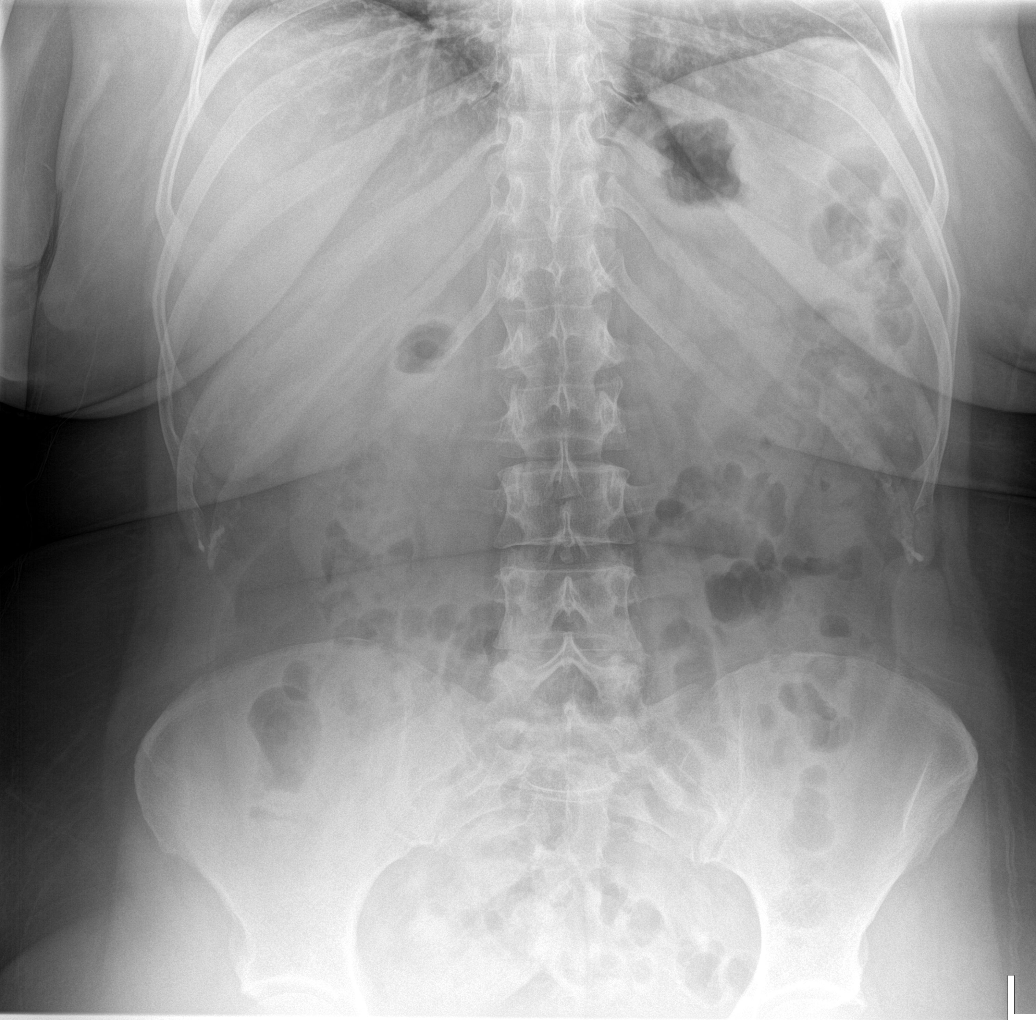

[abdomen supine (1 of 2)]
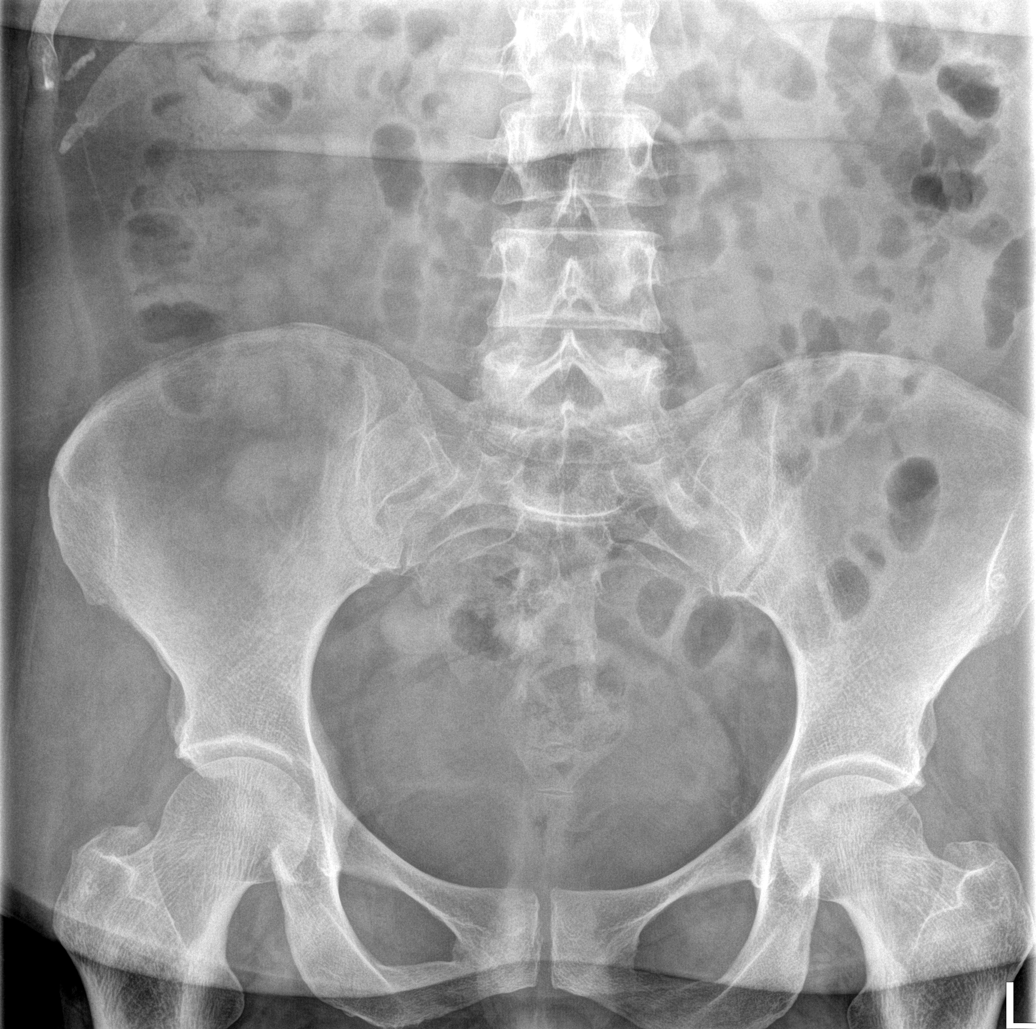

[abdomen supine (2 of 2)]
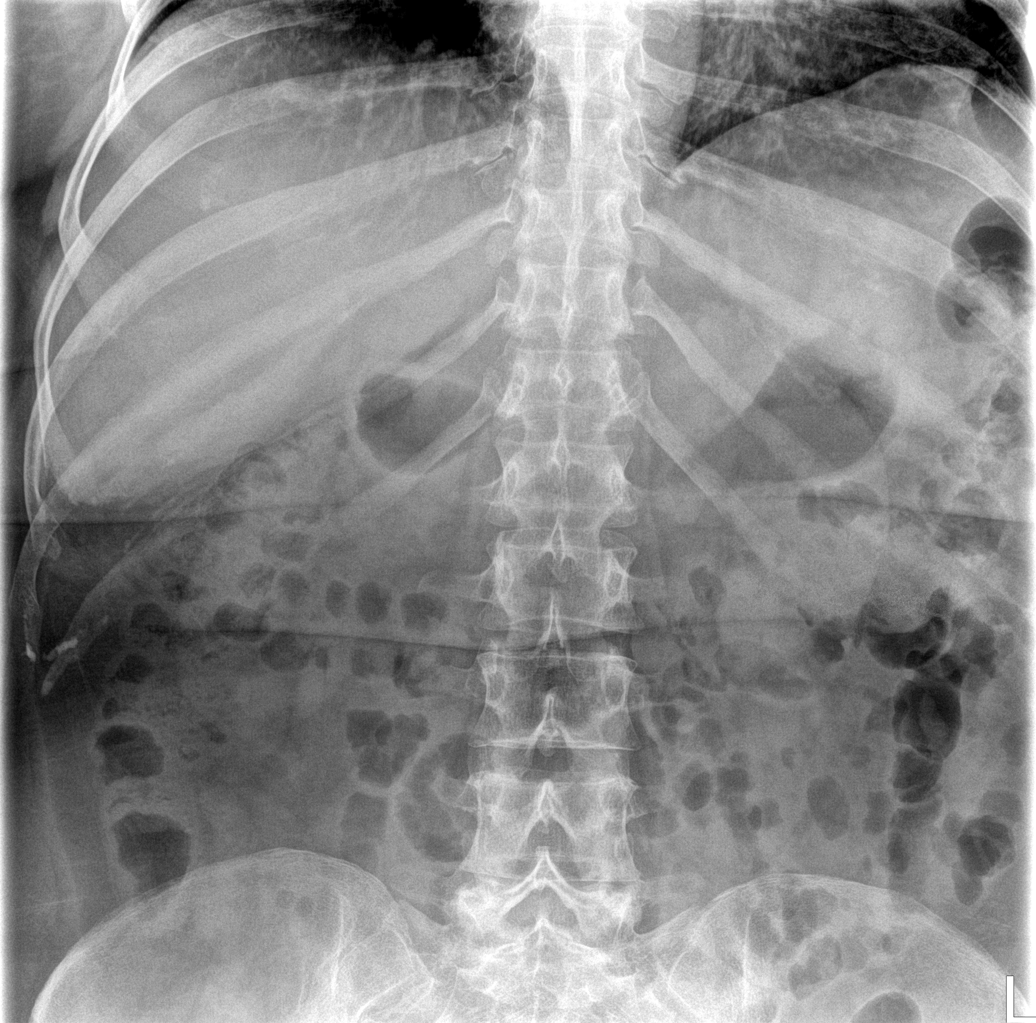

[4 of 4 positions shown; findings below may reference images not displayed]

FINDINGS: PA chest: There is mild lateral left base atelectasis. Lungs are
otherwise clear. Heart size and pulmonary vascularity are normal. No
adenopathy.

Supine and upright abdomen: There is moderate stool in the colon.
There is no bowel dilatation or air-fluid level suggesting
obstruction. No free air. No abnormal calcifications are identified.
IMPRESSION: Slight atelectasis left base laterally. Bowel gas pattern overall
unremarkable without obstruction or free air. No abnormal
calcifications.

## 2015-12-31 ENCOUNTER — Telehealth: Payer: Self-pay | Admitting: *Deleted

## 2015-12-31 NOTE — Telephone Encounter (Signed)
[facility] requested office notes and faxed as urgent [phone] release id)

## 2017-02-26 ENCOUNTER — Encounter (HOSPITAL_COMMUNITY): Payer: Self-pay
